# Patient Record
Sex: Female | Born: 1980 | Race: Black or African American | Hispanic: No | Marital: Single | Smoking: Former smoker
Health system: Southern US, Community
[De-identification: ages and names within clinical notes are randomized; demographics above are authoritative.]

## PROBLEM LIST (undated history)

## (undated) ENCOUNTER — Emergency Department (HOSPITAL_BASED_OUTPATIENT_CLINIC_OR_DEPARTMENT_OTHER): Admission: EM | Payer: Medicaid Other | Source: Home / Self Care

## (undated) DIAGNOSIS — R87619 Unspecified abnormal cytological findings in specimens from cervix uteri: Secondary | ICD-10-CM

## (undated) DIAGNOSIS — I10 Essential (primary) hypertension: Secondary | ICD-10-CM

## (undated) DIAGNOSIS — F419 Anxiety disorder, unspecified: Secondary | ICD-10-CM

## (undated) HISTORY — PX: TONSILLECTOMY AND ADENOIDECTOMY: SUR1326

## (undated) HISTORY — PX: OTHER SURGICAL HISTORY: SHX169

## (undated) HISTORY — DX: Unspecified abnormal cytological findings in specimens from cervix uteri: R87.619

## (undated) HISTORY — PX: BUNIONECTOMY: SHX129

## (undated) HISTORY — DX: Essential (primary) hypertension: I10

---

## 2009-01-18 ENCOUNTER — Emergency Department (HOSPITAL_BASED_OUTPATIENT_CLINIC_OR_DEPARTMENT_OTHER): Admission: EM | Admit: 2009-01-18 | Discharge: 2009-01-18 | Payer: Self-pay | Admitting: Emergency Medicine

## 2009-08-26 ENCOUNTER — Emergency Department (HOSPITAL_BASED_OUTPATIENT_CLINIC_OR_DEPARTMENT_OTHER): Admission: EM | Admit: 2009-08-26 | Discharge: 2009-08-26 | Payer: Self-pay | Admitting: Emergency Medicine

## 2009-11-27 ENCOUNTER — Emergency Department (HOSPITAL_BASED_OUTPATIENT_CLINIC_OR_DEPARTMENT_OTHER): Admission: EM | Admit: 2009-11-27 | Discharge: 2009-11-27 | Payer: Self-pay | Admitting: Emergency Medicine

## 2009-11-27 ENCOUNTER — Ambulatory Visit: Payer: Self-pay | Admitting: Diagnostic Radiology

## 2009-12-15 ENCOUNTER — Emergency Department (HOSPITAL_BASED_OUTPATIENT_CLINIC_OR_DEPARTMENT_OTHER): Admission: EM | Admit: 2009-12-15 | Discharge: 2009-12-15 | Payer: Self-pay | Admitting: Emergency Medicine

## 2010-01-26 ENCOUNTER — Emergency Department (HOSPITAL_BASED_OUTPATIENT_CLINIC_OR_DEPARTMENT_OTHER): Admission: EM | Admit: 2010-01-26 | Discharge: 2010-01-27 | Payer: Self-pay | Admitting: Emergency Medicine

## 2010-01-27 ENCOUNTER — Ambulatory Visit: Payer: Self-pay | Admitting: Radiology

## 2010-02-26 ENCOUNTER — Emergency Department (HOSPITAL_BASED_OUTPATIENT_CLINIC_OR_DEPARTMENT_OTHER): Admission: EM | Admit: 2010-02-26 | Discharge: 2010-02-26 | Payer: Self-pay | Admitting: Emergency Medicine

## 2010-03-05 ENCOUNTER — Emergency Department (HOSPITAL_BASED_OUTPATIENT_CLINIC_OR_DEPARTMENT_OTHER): Admission: EM | Admit: 2010-03-05 | Discharge: 2010-03-06 | Payer: Self-pay | Admitting: Emergency Medicine

## 2010-06-10 ENCOUNTER — Emergency Department (HOSPITAL_BASED_OUTPATIENT_CLINIC_OR_DEPARTMENT_OTHER): Admission: EM | Admit: 2010-06-10 | Discharge: 2010-06-10 | Payer: Self-pay | Admitting: Emergency Medicine

## 2010-07-13 ENCOUNTER — Emergency Department (HOSPITAL_BASED_OUTPATIENT_CLINIC_OR_DEPARTMENT_OTHER): Admission: EM | Admit: 2010-07-13 | Discharge: 2010-07-13 | Payer: Self-pay | Admitting: Emergency Medicine

## 2011-02-17 LAB — URINALYSIS, ROUTINE W REFLEX MICROSCOPIC
Glucose, UA: NEGATIVE mg/dL
Nitrite: NEGATIVE
Urobilinogen, UA: 0.2 mg/dL (ref 0.0–1.0)

## 2011-02-17 LAB — WET PREP, GENITAL: Yeast Wet Prep HPF POC: NONE SEEN

## 2011-02-17 LAB — URINE MICROSCOPIC-ADD ON

## 2011-02-17 LAB — URINE CULTURE
Colony Count: >=100000
Culture  Setup Time: 201108102114

## 2011-02-19 LAB — GC/CHLAMYDIA PROBE AMP, GENITAL
Chlamydia, DNA Probe: NEGATIVE
GC Probe Amp, Genital: NEGATIVE

## 2011-02-19 LAB — WET PREP, GENITAL

## 2011-02-22 LAB — WET PREP, GENITAL
Clue Cells Wet Prep HPF POC: NONE SEEN
Trich, Wet Prep: NONE SEEN
Yeast Wet Prep HPF POC: NONE SEEN

## 2011-02-22 LAB — URINALYSIS, ROUTINE W REFLEX MICROSCOPIC
Glucose, UA: NEGATIVE mg/dL
pH: 7 (ref 5.0–8.0)

## 2011-02-27 LAB — WOUND CULTURE

## 2011-03-10 LAB — RAPID STREP SCREEN (MED CTR MEBANE ONLY): Streptococcus, Group A Screen (Direct): NEGATIVE

## 2011-09-03 ENCOUNTER — Emergency Department (HOSPITAL_BASED_OUTPATIENT_CLINIC_OR_DEPARTMENT_OTHER)
Admission: EM | Admit: 2011-09-03 | Discharge: 2011-09-03 | Disposition: A | Discharge status: Home / Self Care | Payer: Medicaid Other | Attending: Emergency Medicine | Admitting: Emergency Medicine

## 2011-09-03 ENCOUNTER — Encounter: Payer: Self-pay | Admitting: *Deleted

## 2011-09-03 DIAGNOSIS — Z4689 Encounter for fitting and adjustment of other specified devices: Secondary | ICD-10-CM | POA: Insufficient documentation

## 2011-09-03 DIAGNOSIS — M79609 Pain in unspecified limb: Secondary | ICD-10-CM | POA: Insufficient documentation

## 2011-09-03 HISTORY — DX: Anxiety disorder, unspecified: F41.9

## 2011-09-03 MED ORDER — HYDROCODONE-ACETAMINOPHEN 5-325 MG PO TABS: 2.0000 | ORAL_TABLET | ORAL | Status: AC | PRN
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
## 2011-09-03 NOTE — ED Provider Notes (Signed)
Medical screening examination/treatment/procedure(s) were performed by non-physician practitioner and as supervising physician I was immediately available for consultation/collaboration.   Joya Gaskins, MD 09/03/11 [phone]
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
## 2012-04-26 ENCOUNTER — Emergency Department (HOSPITAL_BASED_OUTPATIENT_CLINIC_OR_DEPARTMENT_OTHER)
Admission: EM | Admit: 2012-04-26 | Discharge: 2012-04-26 | Disposition: A | Discharge status: Home / Self Care | Payer: Self-pay | Attending: Emergency Medicine | Admitting: Emergency Medicine

## 2012-04-26 ENCOUNTER — Encounter (HOSPITAL_BASED_OUTPATIENT_CLINIC_OR_DEPARTMENT_OTHER): Payer: Self-pay | Admitting: *Deleted

## 2012-04-26 DIAGNOSIS — G43909 Migraine, unspecified, not intractable, without status migrainosus: Secondary | ICD-10-CM | POA: Insufficient documentation

## 2012-04-26 DIAGNOSIS — F17200 Nicotine dependence, unspecified, uncomplicated: Secondary | ICD-10-CM | POA: Insufficient documentation

## 2012-04-26 MED ORDER — SUMATRIPTAN SUCCINATE 50 MG PO TABS: 50.0000 mg | ORAL_TABLET | ORAL | Status: DC | PRN

## 2012-04-26 MED ORDER — DROPERIDOL 2.5 MG/ML IJ SOLN
2.5000 mg | Freq: Once | INTRAMUSCULAR | Status: AC
  Administered 2012-04-26: 2.5 mg via INTRAVENOUS
  Filled 2012-04-26: qty 2

## 2012-04-26 MED ORDER — PROMETHAZINE HCL 12.5 MG PO TABS: 12.5000 mg | ORAL_TABLET | Freq: Four times a day (QID) | ORAL | Status: DC | PRN

## 2012-04-26 MED ORDER — SODIUM CHLORIDE 0.9 % IV BOLUS (SEPSIS)
1000.0000 mL | Freq: Once | INTRAVENOUS | Status: AC
  Administered 2012-04-26: 1000 mL via INTRAVENOUS

## 2012-04-26 MED ORDER — SUMATRIPTAN SUCCINATE 6 MG/0.5ML ~~LOC~~ SOLN
6.0000 mg | Freq: Once | SUBCUTANEOUS | Status: AC
  Administered 2012-04-26: 6 mg via SUBCUTANEOUS
  Filled 2012-04-26: qty 0.5

## 2012-04-26 NOTE — ED Provider Notes (Signed)
History     CSN: [id number]  Arrival date & time 04/26/12  1945   First MD Initiated Contact with Patient 04/26/12 2057      No chief complaint on file.   (Consider location/radiation/quality/duration/timing/severity/associated sxs/prior treatment) HPI The patient is a 31 yo woman, history of migraines and anxiety, presenting with headache.  The patient awoke this morning at 6:30 am with a headache, described as a right, frontal, throbbing head pain, with no radiation, with associated photophobia, phonophobia, and vomiting x3.  She took a tramadol with no relief of symptoms.  She notes a history of similar headaches, previously diagnosed as migraines, occurring 1-2 times/year.  No focal neuro symptoms, fever, chills, cp, SOB.   Past Medical History  Diagnosis Date  . Migraine   . Anxiety     Past Surgical History  Procedure Date  . Breast lumpectomy   . Tonsillectomy and adenoidectomy   . Bunionectomy     No family history on file.  History  Substance Use Topics  . Smoking status: Current Some Day Smoker  . Smokeless tobacco: Not on file  . Alcohol Use: No    OB History    Grav Para Term Preterm Abortions TAB SAB Ect Mult Living                  Review of Systems General: no fevers, chills, changes in weight, changes in appetite Skin: no rash HEENT: no blurry vision, hearing changes, sore throat Pulm: no dyspnea, coughing, wheezing CV: no chest pain, palpitations, shortness of breath Abd: no abdominal pain, diarrhea/constipation GU: no dysuria, hematuria, polyuria Ext: no arthralgias, myalgias Neuro: no weakness, numbness, or tingling  Allergies  Penicillins and Sulfa antibiotics  Home Medications   Current Outpatient Rx  Name Route Sig Dispense Refill  . ONE-A-DAY WOMENS FORMULA PO Oral Take 1 tablet by mouth daily.      BP 148/95  Pulse 84  Temp(Src) 98.4 F (36.9 C) (Oral)  Resp 20  SpO2 100%  Physical Exam General: lying in bed, eyes  closed, appears very uncomfortable HEENT: pupils equal round and reactive to light though exam limited by photophobia, EOMI, oropharynx clear and non-erythematous  Neck: supple, no lymphadenopathy Lungs: clear to ascultation bilaterally, normal work of respiration, no wheezes, rales, ronchi Heart: regular rate and rhythm, no murmurs, gallops, or rubs Abdomen: soft, non-tender, non-distended, normal bowel sounds Msk: no joint edema, warmth, or erythema Extremities: no cyanosis, clubbing, or edema Neurologic: alert & oriented X3, cranial nerves II-XII intact, strength grossly intact, sensation intact to light touch  ED Course  Procedures (including critical care time)  Labs Reviewed - No data to display No results found.   No diagnosis found.    MDM   # Migraine - the patient is a 31 yo woman, history of migraines, presenting with a headache consistent with acute migraine. -1 L NS -sumatriptan, droperidol  # Dispo - likely discharge home pending symptomatic improvement.   Addendum 10:15 pm - symptoms dramatically improved, pain decreased from 10/10 to 3/10.  Will discharge home.  Linward Headland, MD 04/26/12 2215

## 2012-04-26 NOTE — ED Notes (Signed)
Woke this am with headache, nausea and vomiting.

## 2012-04-26 NOTE — Discharge Instructions (Signed)

## 2012-04-28 NOTE — ED Provider Notes (Signed)
I saw and evaluated the patient, reviewed the resident's note and I agree with the findings and plan.   .Face to face Exam:  General:  Awake HEENT:  Atraumatic Resp:  Normal effort Abd:  Nondistended Neuro:No focal weakness Lymph: No adenopathy   Jaeleigh Monaco L Daine Gunther, MD 04/28/12 0946 

## 2012-08-21 ENCOUNTER — Encounter (HOSPITAL_BASED_OUTPATIENT_CLINIC_OR_DEPARTMENT_OTHER): Payer: Self-pay

## 2012-08-21 ENCOUNTER — Emergency Department (HOSPITAL_BASED_OUTPATIENT_CLINIC_OR_DEPARTMENT_OTHER)
Admission: EM | Admit: 2012-08-21 | Discharge: 2012-08-21 | Disposition: A | Discharge status: Home / Self Care | Payer: Medicaid Other | Attending: Emergency Medicine | Admitting: Emergency Medicine

## 2012-08-21 DIAGNOSIS — Z882 Allergy status to sulfonamides status: Secondary | ICD-10-CM | POA: Insufficient documentation

## 2012-08-21 DIAGNOSIS — F411 Generalized anxiety disorder: Secondary | ICD-10-CM | POA: Insufficient documentation

## 2012-08-21 DIAGNOSIS — Z880 Allergy status to penicillin: Secondary | ICD-10-CM | POA: Insufficient documentation

## 2012-08-21 DIAGNOSIS — T8149XA Infection following a procedure, other surgical site, initial encounter: Secondary | ICD-10-CM

## 2012-08-21 DIAGNOSIS — Z87891 Personal history of nicotine dependence: Secondary | ICD-10-CM | POA: Insufficient documentation

## 2012-08-21 DIAGNOSIS — Y849 Medical procedure, unspecified as the cause of abnormal reaction of the patient, or of later complication, without mention of misadventure at the time of the procedure: Secondary | ICD-10-CM | POA: Insufficient documentation

## 2012-08-21 DIAGNOSIS — T8140XA Infection following a procedure, unspecified, initial encounter: Secondary | ICD-10-CM | POA: Insufficient documentation

## 2012-08-21 MED ORDER — HYDROCODONE-ACETAMINOPHEN 5-325 MG PO TABS: 2.0000 | ORAL_TABLET | ORAL | Status: DC | PRN

## 2012-08-21 MED ORDER — CEPHALEXIN 500 MG PO CAPS: 500.0000 mg | ORAL_CAPSULE | Freq: Four times a day (QID) | ORAL | Status: DC

## 2012-08-21 MED ORDER — DOXYCYCLINE HYCLATE 100 MG PO CAPS: 100.0000 mg | ORAL_CAPSULE | Freq: Two times a day (BID) | ORAL | Status: DC

## 2012-08-21 MED ORDER — HYDROCODONE-ACETAMINOPHEN 5-325 MG PO TABS
2.0000 | ORAL_TABLET | Freq: Once | ORAL | Status: AC
  Administered 2012-08-21: 2 via ORAL
  Filled 2012-08-21: qty 2

## 2012-08-21 MED ORDER — DOXYCYCLINE HYCLATE 100 MG PO TABS
100.0000 mg | ORAL_TABLET | Freq: Once | ORAL | Status: AC
  Administered 2012-08-21: 100 mg via ORAL
  Filled 2012-08-21: qty 1

## 2012-08-21 MED ORDER — CEPHALEXIN 250 MG PO CAPS
500.0000 mg | ORAL_CAPSULE | Freq: Once | ORAL | Status: AC
  Administered 2012-08-21: 500 mg via ORAL
  Filled 2012-08-21: qty 2

## 2012-08-21 NOTE — ED Provider Notes (Signed)
Medical screening examination/treatment/procedure(s) were performed by non-physician practitioner and as supervising physician I was immediately available for consultation/collaboration.  Stephannie Broner, MD 08/21/12 2316 

## 2012-08-21 NOTE — ED Notes (Signed)
Reports cyst removed from left breast last week-sutures removed yesterday-drainage started today

## 2012-08-21 NOTE — ED Provider Notes (Signed)
History     CSN: [id number]  Arrival date & time 08/21/12  2021   First MD Initiated Contact with Patient 08/21/12 2054      Chief Complaint  Patient presents with  . Breast Problem    (Consider location/radiation/quality/duration/timing/severity/associated sxs/prior treatment) Patient is a 31 y.o. female presenting with abscess. The history is provided by the patient. No language interpreter was used.  Abscess  This is a new problem. The current episode started more than one week ago. The onset was gradual. The problem has been unchanged. The abscess is characterized by redness and draining. There were no sick contacts.  Pt reports Dr. Simonne Maffucci cut a cyst out of her left breast a week ago.  Pt reports sutures removed yesterday.Pt reports area is swollen and has push draining  Past Medical History  Diagnosis Date  . Migraine   . Anxiety     Past Surgical History  Procedure Date  . Breast lumpectomy   . Tonsillectomy and adenoidectomy   . Bunionectomy   . Breast cyst     No family history on file.  History  Substance Use Topics  . Smoking status: Former Games developer  . Smokeless tobacco: Not on file  . Alcohol Use: No    OB History    Grav Para Term Preterm Abortions TAB SAB Ect Mult Living                  Review of Systems  Skin: Positive for wound.  All other systems reviewed and are negative.    Allergies  Penicillins and Sulfa antibiotics  Home Medications   Current Outpatient Rx  Name Route Sig Dispense Refill  . ONE-A-DAY WOMENS FORMULA PO Oral Take 1 tablet by mouth daily.    Marland Kitchen PROMETHAZINE HCL 12.5 MG PO TABS Oral Take 1 tablet (12.5 mg total) by mouth every 6 (six) hours as needed for nausea. 30 tablet 0  . SUMATRIPTAN SUCCINATE 50 MG PO TABS Oral Take 1 tablet (50 mg total) by mouth every 2 (two) hours as needed for migraine (Do not take more than 200 mg in 24 hours). 10 tablet 0    BP 145/94  Pulse 95  Temp 99.2 F (37.3 C) (Oral)  Resp 16  Ht  5\' 7"  (1.702 m)  Wt 184 lb (83.462 kg)  BMI 28.82 kg/m2  SpO2 100%  LMP 07/10/2012  Physical Exam  Nursing note and vitals reviewed. Constitutional: She appears well-developed and well-nourished.  HENT:  Head: Normocephalic.  Pulmonary/Chest:       Incision left breast,  Purulent drainage,  Erythema around area  Musculoskeletal: Normal range of motion.  Neurological: She is alert.  Skin: There is erythema.    ED Course  Procedures (including critical care time)  Labs Reviewed - No data to display No results found.   No diagnosis found.    MDM  Pt given rx for keflex, doxycycline and hydrocodone        Lonia Skinner [place], [place] 08/21/12 2120

## 2012-09-10 ENCOUNTER — Emergency Department (HOSPITAL_BASED_OUTPATIENT_CLINIC_OR_DEPARTMENT_OTHER): Payer: No Typology Code available for payment source

## 2012-09-10 ENCOUNTER — Emergency Department (HOSPITAL_BASED_OUTPATIENT_CLINIC_OR_DEPARTMENT_OTHER)
Admission: EM | Admit: 2012-09-10 | Discharge: 2012-09-10 | Disposition: A | Discharge status: Home / Self Care | Payer: No Typology Code available for payment source | Attending: Emergency Medicine | Admitting: Emergency Medicine

## 2012-09-10 ENCOUNTER — Encounter (HOSPITAL_BASED_OUTPATIENT_CLINIC_OR_DEPARTMENT_OTHER): Payer: Self-pay | Admitting: *Deleted

## 2012-09-10 DIAGNOSIS — F411 Generalized anxiety disorder: Secondary | ICD-10-CM | POA: Insufficient documentation

## 2012-09-10 DIAGNOSIS — Z882 Allergy status to sulfonamides status: Secondary | ICD-10-CM | POA: Insufficient documentation

## 2012-09-10 DIAGNOSIS — G43909 Migraine, unspecified, not intractable, without status migrainosus: Secondary | ICD-10-CM | POA: Insufficient documentation

## 2012-09-10 DIAGNOSIS — Y92410 Unspecified street and highway as the place of occurrence of the external cause: Secondary | ICD-10-CM | POA: Insufficient documentation

## 2012-09-10 DIAGNOSIS — S39012A Strain of muscle, fascia and tendon of lower back, initial encounter: Secondary | ICD-10-CM

## 2012-09-10 DIAGNOSIS — Z880 Allergy status to penicillin: Secondary | ICD-10-CM | POA: Insufficient documentation

## 2012-09-10 DIAGNOSIS — Z87891 Personal history of nicotine dependence: Secondary | ICD-10-CM | POA: Insufficient documentation

## 2012-09-10 DIAGNOSIS — S335XXA Sprain of ligaments of lumbar spine, initial encounter: Secondary | ICD-10-CM | POA: Insufficient documentation
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
## 2012-10-05 ENCOUNTER — Emergency Department (HOSPITAL_BASED_OUTPATIENT_CLINIC_OR_DEPARTMENT_OTHER)
Admission: EM | Admit: 2012-10-05 | Discharge: 2012-10-06 | Discharge status: Against Medical Advice | Payer: Medicaid Other | Attending: Emergency Medicine | Admitting: Emergency Medicine

## 2012-10-05 ENCOUNTER — Encounter (HOSPITAL_BASED_OUTPATIENT_CLINIC_OR_DEPARTMENT_OTHER): Payer: Self-pay | Admitting: *Deleted

## 2012-10-05 DIAGNOSIS — R51 Headache: Secondary | ICD-10-CM

## 2012-10-05 DIAGNOSIS — Z87891 Personal history of nicotine dependence: Secondary | ICD-10-CM | POA: Insufficient documentation

## 2012-10-05 DIAGNOSIS — F411 Generalized anxiety disorder: Secondary | ICD-10-CM | POA: Insufficient documentation

## 2012-10-05 MED ORDER — METOCLOPRAMIDE HCL 5 MG/ML IJ SOLN
10.0000 mg | Freq: Once | INTRAMUSCULAR | Status: DC
  Filled 2012-10-05: qty 2

## 2012-10-05 MED ORDER — KETOROLAC TROMETHAMINE 60 MG/2ML IM SOLN
60.0000 mg | Freq: Once | INTRAMUSCULAR | Status: DC
  Filled 2012-10-05: qty 2

## 2012-10-05 MED ORDER — DIPHENHYDRAMINE HCL 50 MG/ML IJ SOLN
25.0000 mg | Freq: Once | INTRAMUSCULAR | Status: DC
  Filled 2012-10-05: qty 1

## 2012-10-05 NOTE — ED Notes (Signed)
C/o "migraine" h/a that started this morning. States pain is left frontal. C/o light sensitivity. C/o nausea. States this is typical of her migraine h/a's. Pt. States she does have a ride home.

## 2012-10-05 NOTE — ED Provider Notes (Signed)
History     CSN: [id number]  Arrival date & time 10/05/12  2144   First MD Initiated Contact with Patient 10/05/12 2230      Chief Complaint  Patient presents with  . Migraine    (Consider location/radiation/quality/duration/timing/severity/associated sxs/prior treatment) Patient is a 31 y.o. female presenting with migraines. The history is provided by the patient.  Migraine This is a new problem. The current episode started today. The problem occurs constantly. The problem has been gradually worsening. Associated symptoms include headaches. Nothing aggravates the symptoms.   Pt complains of a headache today.  Pt reports she has headaches.  Pt reports she has gotten injections in the past that have helped headaches.  Past Medical History  Diagnosis Date  . Migraine   . Anxiety     Past Surgical History  Procedure Date  . Breast lumpectomy   . Tonsillectomy and adenoidectomy   . Bunionectomy   . Breast cyst     No family history on file.  History  Substance Use Topics  . Smoking status: Former Games developer  . Smokeless tobacco: Not on file  . Alcohol Use: No    OB History    Grav Para Term Preterm Abortions TAB SAB Ect Mult Living                  Review of Systems  Neurological: Positive for headaches.  All other systems reviewed and are negative.    Allergies  Penicillins and Sulfa antibiotics  Home Medications   Current Outpatient Rx  Name Route Sig Dispense Refill  . ONE-A-DAY WOMENS FORMULA PO Oral Take 1 tablet by mouth daily.    . CEPHALEXIN 500 MG PO CAPS Oral Take 1 capsule (500 mg total) by mouth 4 (four) times daily. 40 capsule 0  . DOXYCYCLINE HYCLATE 100 MG PO CAPS Oral Take 1 capsule (100 mg total) by mouth 2 (two) times daily. 20 capsule 0  . HYDROCODONE-ACETAMINOPHEN 5-325 MG PO TABS Oral Take 2 tablets by mouth every 4 (four) hours as needed for pain. 10 tablet 0  . PROMETHAZINE HCL 12.5 MG PO TABS Oral Take 1 tablet (12.5 mg total) by mouth  every 6 (six) hours as needed for nausea. 30 tablet 0  . SUMATRIPTAN SUCCINATE 50 MG PO TABS Oral Take 1 tablet (50 mg total) by mouth every 2 (two) hours as needed for migraine (Do not take more than 200 mg in 24 hours). 10 tablet 0    BP 139/100  Pulse 94  Temp 98 F (36.7 C)  Resp 18  Ht 5\' 7"  (1.702 m)  Wt 184 lb (83.462 kg)  BMI 28.82 kg/m2  SpO2 99%  LMP 09/30/2012  Physical Exam  Nursing note and vitals reviewed. Constitutional: She is oriented to person, place, and time. She appears well-developed and well-nourished.  HENT:  Head: Normocephalic and atraumatic.  Right Ear: External ear normal.  Left Ear: External ear normal.  Nose: Nose normal.  Mouth/Throat: Oropharynx is clear and moist.  Eyes: Conjunctivae normal and EOM are normal. Pupils are equal, round, and reactive to light.  Neck: Normal range of motion. Neck supple.  Cardiovascular: Normal rate, regular rhythm and normal heart sounds.   Pulmonary/Chest: Effort normal and breath sounds normal.  Abdominal: Soft.  Musculoskeletal: Normal range of motion.  Neurological: She is alert and oriented to person, place, and time.  Skin: Skin is warm.  Psychiatric: She has a normal mood and affect.    ED Course  Procedures (  including critical care time)  Labs Reviewed - No data to display No results found.   No diagnosis found.    MDM  I ordered reglan, torodol and benadryl.    Pt left ama  before getting medications        Lonia Skinner [place], [place] 10/05/12 2355

## 2012-10-05 NOTE — ED Notes (Signed)
Pt reports photophobia, some nausea.  Pt denies any visual changes or recent trauma.  Neuro assessment unremarkable

## 2012-10-05 NOTE — ED Notes (Signed)
Pt left AMA. Pt was found not to be in her room or in the waiting room. Charge nurse notified that she had left the building. Medication returned.

## 2012-10-06 NOTE — ED Provider Notes (Signed)
Medical screening examination/treatment/procedure(s) were performed by non-physician practitioner and as supervising physician I was immediately available for consultation/collaboration.  Jones Skene, M.D.     Jones Skene, MD 10/06/12 1014

## 2013-01-12 ENCOUNTER — Emergency Department (HOSPITAL_BASED_OUTPATIENT_CLINIC_OR_DEPARTMENT_OTHER)
Admission: EM | Admit: 2013-01-12 | Discharge: 2013-01-12 | Disposition: A | Discharge status: Home / Self Care | Payer: Medicaid Other | Attending: Emergency Medicine | Admitting: Emergency Medicine

## 2013-01-12 ENCOUNTER — Encounter (HOSPITAL_BASED_OUTPATIENT_CLINIC_OR_DEPARTMENT_OTHER): Payer: Self-pay | Admitting: *Deleted

## 2013-01-12 DIAGNOSIS — Z8659 Personal history of other mental and behavioral disorders: Secondary | ICD-10-CM | POA: Insufficient documentation

## 2013-01-12 DIAGNOSIS — Z87891 Personal history of nicotine dependence: Secondary | ICD-10-CM | POA: Insufficient documentation

## 2013-01-12 DIAGNOSIS — H53149 Visual discomfort, unspecified: Secondary | ICD-10-CM | POA: Insufficient documentation

## 2013-01-12 DIAGNOSIS — G43909 Migraine, unspecified, not intractable, without status migrainosus: Secondary | ICD-10-CM | POA: Insufficient documentation

## 2013-01-12 DIAGNOSIS — R110 Nausea: Secondary | ICD-10-CM | POA: Insufficient documentation

## 2013-01-12 MED ORDER — DIPHENHYDRAMINE HCL 50 MG/ML IJ SOLN
50.0000 mg | Freq: Once | INTRAMUSCULAR | Status: AC
  Administered 2013-01-12: 50 mg via INTRAMUSCULAR
  Filled 2013-01-12: qty 1

## 2013-01-12 MED ORDER — METOCLOPRAMIDE HCL 10 MG PO TABS
10.0000 mg | ORAL_TABLET | Freq: Once | ORAL | Status: AC
  Administered 2013-01-12: 10 mg via ORAL
  Filled 2013-01-12: qty 1

## 2013-01-12 MED ORDER — METOCLOPRAMIDE HCL 5 MG/ML IJ SOLN
10.0000 mg | Freq: Once | INTRAMUSCULAR | Status: AC
  Administered 2013-01-12: 10 mg via INTRAVENOUS
  Filled 2013-01-12: qty 2

## 2013-01-12 MED ORDER — SODIUM CHLORIDE 0.9 % IV BOLUS (SEPSIS)
1000.0000 mL | Freq: Once | INTRAVENOUS | Status: AC
  Administered 2013-01-12: 1000 mL via INTRAVENOUS

## 2013-01-12 MED ORDER — PROMETHAZINE HCL 25 MG/ML IJ SOLN
25.0000 mg | Freq: Once | INTRAMUSCULAR | Status: AC
  Administered 2013-01-12: 25 mg via INTRAVENOUS
  Filled 2013-01-12: qty 1

## 2013-01-12 NOTE — ED Provider Notes (Signed)
History  This chart was scribed for Richardean Canal, MD by Shari Heritage, ED Scribe. The patient was seen in room MH05/MH05. Patient's care was started at 1923.  CSN: [id number]  Arrival date & time 01/12/13  9562   First MD Initiated Contact with Patient 01/12/13 1923      Chief Complaint  Patient presents with  . Migraine     The history is provided by the patient. No language interpreter was used.     HPI Comments: Lindsey Ruiz is a 32 y.o. female who presents to the Emergency Department complaining of worsening, moderate, constant, non-radiating headache onset yesterday afternoon. There is associated nausea and photophobia. Patient took 2 Aleve PTA in the ED about 2 hours ago with no relief. Patient has seen a neurologist for this problem and was prescribed medications, but patient cannot remember the name and hasn't been on them for several weeks or months. Patient was last seen for a migraine here in October 2013. She has been treated by both IV meds and injections in the past. Patient's other medical history includes anxiety.     Past Medical History  Diagnosis Date  . Migraine   . Anxiety     Past Surgical History  Procedure Laterality Date  . Breast lumpectomy    . Tonsillectomy and adenoidectomy    . Bunionectomy    . Breast cyst      History reviewed. No pertinent family history.  History  Substance Use Topics  . Smoking status: Former Games developer  . Smokeless tobacco: Not on file  . Alcohol Use: No    OB History   Grav Para Term Preterm Abortions TAB SAB Ect Mult Living                  Review of Systems  Eyes: Positive for photophobia.  Gastrointestinal: Positive for nausea. Negative for vomiting.  Neurological: Positive for headaches.  All other systems reviewed and are negative.    Allergies  Penicillins and Sulfa antibiotics  Home Medications   Current Outpatient Rx  Name  Route  Sig  Dispense  Refill  . cephALEXin (KEFLEX) 500 MG capsule  Oral   Take 1 capsule (500 mg total) by mouth 4 (four) times daily.   40 capsule   0   . doxycycline (VIBRAMYCIN) 100 MG capsule   Oral   Take 1 capsule (100 mg total) by mouth 2 (two) times daily.   20 capsule   0   . HYDROcodone-acetaminophen (NORCO/VICODIN) 5-325 MG per tablet   Oral   Take 2 tablets by mouth every 4 (four) hours as needed for pain.   10 tablet   0   . Multiple Vitamins-Calcium (ONE-A-DAY WOMENS FORMULA PO)   Oral   Take 1 tablet by mouth daily.         Marland Kitchen EXPIRED: promethazine (PHENERGAN) 12.5 MG tablet   Oral   Take 1 tablet (12.5 mg total) by mouth every 6 (six) hours as needed for nausea.   30 tablet   0   . SUMAtriptan (IMITREX) 50 MG tablet   Oral   Take 1 tablet (50 mg total) by mouth every 2 (two) hours as needed for migraine (Do not take more than 200 mg in 24 hours).   10 tablet   0     Triage Vitals: BP 146/97  Pulse 105  Temp(Src) 98.4 F (36.9 C) (Oral)  Resp 16  Ht 5\' 7"  (1.702 m)  Wt 184 lb (83.462  kg)  BMI 28.81 kg/m2  SpO2 100%  LMP 12/18/2012  Physical Exam  Constitutional: She is oriented to person, place, and time. She appears well-developed and well-nourished. No distress.  HENT:  Head: Normocephalic and atraumatic.  Eyes: Conjunctivae and EOM are normal. Pupils are equal, round, and reactive to light.  Neck: Normal range of motion.  Cardiovascular: Normal rate, regular rhythm and normal heart sounds.   Pulmonary/Chest: Effort normal and breath sounds normal.  Neurological: She is alert and oriented to person, place, and time.  No focal neurological deficits.   Skin: Skin is warm and dry. No rash noted.  Psychiatric: She has a normal mood and affect. Her behavior is normal.    ED Course  Procedures (including critical care time) DIAGNOSTIC STUDIES: Oxygen Saturation is 100% on room air, normal by my interpretation.    COORDINATION OF CARE: 7:37 PM- Patient informed of current plan for treatment and evaluation  and agrees with plan at this time.   8:12 PM- Patient's headache is unchanged after 1 tablet of Reglan 10 mg and shot of Benadryl 50 mg. Will treat with IV medicines.   8:15 PM- Have ordered 1000 ml IV fluids, Phenergan 25 mg and additional Reglan 10 mg dose.  9:54 PM- Patient is improved. Sleeping, resting comfortably.   Labs Reviewed - No data to display No results found.   No diagnosis found.    MDM  Lindsey Ruiz is a 32 y.o. female here with headaches. Headaches similar to her typical migraines. No concerning red flags for subarachnoid. Given reglan, phenergan. Now sleeping comfortably. Will d/c home. Recommend that she f/u with her neurologist.     I personally performed the services described in this documentation, which was scribed in my presence. The recorded information has been reviewed and is accurate.    Richardean Canal, MD 01/12/13 2156

## 2013-01-12 NOTE — ED Notes (Signed)
Migraine since yesterday. +nausea +sensitivity to light PERL

## 2013-02-18 ENCOUNTER — Emergency Department (HOSPITAL_BASED_OUTPATIENT_CLINIC_OR_DEPARTMENT_OTHER)
Admission: EM | Admit: 2013-02-18 | Discharge: 2013-02-18 | Disposition: A | Discharge status: Home / Self Care | Payer: Medicaid Other | Attending: Emergency Medicine | Admitting: Emergency Medicine

## 2013-02-18 ENCOUNTER — Encounter (HOSPITAL_BASED_OUTPATIENT_CLINIC_OR_DEPARTMENT_OTHER): Payer: Self-pay | Admitting: *Deleted

## 2013-02-18 DIAGNOSIS — Z87891 Personal history of nicotine dependence: Secondary | ICD-10-CM | POA: Insufficient documentation

## 2013-02-18 DIAGNOSIS — Z3202 Encounter for pregnancy test, result negative: Secondary | ICD-10-CM | POA: Insufficient documentation

## 2013-02-18 DIAGNOSIS — N390 Urinary tract infection, site not specified: Secondary | ICD-10-CM

## 2013-02-18 DIAGNOSIS — Z8659 Personal history of other mental and behavioral disorders: Secondary | ICD-10-CM | POA: Insufficient documentation

## 2013-02-18 DIAGNOSIS — Z79899 Other long term (current) drug therapy: Secondary | ICD-10-CM | POA: Insufficient documentation

## 2013-02-18 DIAGNOSIS — Z8679 Personal history of other diseases of the circulatory system: Secondary | ICD-10-CM | POA: Insufficient documentation

## 2013-02-18 LAB — URINE MICROSCOPIC-ADD ON

## 2013-02-18 LAB — URINALYSIS, ROUTINE W REFLEX MICROSCOPIC
Bilirubin Urine: NEGATIVE
Nitrite: NEGATIVE
Specific Gravity, Urine: 1.012 (ref 1.005–1.030)
Urobilinogen, UA: 1 mg/dL (ref 0.0–1.0)
pH: 7 (ref 5.0–8.0)

## 2013-02-18 LAB — PREGNANCY, URINE: Preg Test, Ur: NEGATIVE

## 2013-02-18 MED ORDER — NITROFURANTOIN MONOHYD MACRO 100 MG PO CAPS: 100.0000 mg | ORAL_CAPSULE | Freq: Two times a day (BID) | ORAL | Status: DC

## 2013-02-18 MED ORDER — PHENAZOPYRIDINE HCL 200 MG PO TABS: 200.0000 mg | ORAL_TABLET | Freq: Three times a day (TID) | ORAL | Status: DC

## 2013-02-18 NOTE — ED Notes (Signed)
Woke this am with hematuria.

## 2013-02-18 NOTE — ED Provider Notes (Signed)
History     CSN: [id number]  Arrival date & time 02/18/13  1741   First MD Initiated Contact with Patient 02/18/13 1803      Chief Complaint  Patient presents with  . Hematuria    (Consider location/radiation/quality/duration/timing/severity/associated sxs/prior treatment) HPI Comments: Pt states that she noticed blood in urine with wiping:pt states that it is not with every urination:pt denies fever,n/v/d:pt states that she is having some lower abdominal pressure and burning with urination:pt denies vaginal discharge  Patient is a 32 y.o. female presenting with hematuria. The history is provided by the patient. No language interpreter was used.  Hematuria This is a new problem. The current episode started today. The problem occurs 2 to 4 times per day. The problem has been unchanged. Pertinent negatives include no nausea or vomiting. Nothing aggravates the symptoms. She has tried nothing for the symptoms.    Past Medical History  Diagnosis Date  . Migraine   . Anxiety     Past Surgical History  Procedure Laterality Date  . Breast lumpectomy    . Tonsillectomy and adenoidectomy    . Bunionectomy    . Breast cyst      No family history on file.  History  Substance Use Topics  . Smoking status: Former Games developer  . Smokeless tobacco: Not on file  . Alcohol Use: No    OB History   Grav Para Term Preterm Abortions TAB SAB Ect Mult Living                  Review of Systems  Constitutional: Negative.   Respiratory: Negative.   Cardiovascular: Negative.   Gastrointestinal: Negative for nausea and vomiting.  Genitourinary: Positive for hematuria.    Allergies  Penicillins and Sulfa antibiotics  Home Medications   Current Outpatient Rx  Name  Route  Sig  Dispense  Refill  . Topiramate (TOPAMAX PO)   Oral   Take by mouth.         . cephALEXin (KEFLEX) 500 MG capsule   Oral   Take 1 capsule (500 mg total) by mouth 4 (four) times daily.   40 capsule   0    . doxycycline (VIBRAMYCIN) 100 MG capsule   Oral   Take 1 capsule (100 mg total) by mouth 2 (two) times daily.   20 capsule   0   . HYDROcodone-acetaminophen (NORCO/VICODIN) 5-325 MG per tablet   Oral   Take 2 tablets by mouth every 4 (four) hours as needed for pain.   10 tablet   0   . Multiple Vitamins-Calcium (ONE-A-DAY WOMENS FORMULA PO)   Oral   Take 1 tablet by mouth daily.         Marland Kitchen EXPIRED: promethazine (PHENERGAN) 12.5 MG tablet   Oral   Take 1 tablet (12.5 mg total) by mouth every 6 (six) hours as needed for nausea.   30 tablet   0   . SUMAtriptan (IMITREX) 50 MG tablet   Oral   Take 1 tablet (50 mg total) by mouth every 2 (two) hours as needed for migraine (Do not take more than 200 mg in 24 hours).   10 tablet   0     LMP 01/15/2013  Physical Exam  Nursing note and vitals reviewed. Constitutional: She is oriented to person, place, and time. She appears well-developed and well-nourished.  HENT:  Head: Normocephalic and atraumatic.  Cardiovascular: Normal rate and regular rhythm.   Pulmonary/Chest: Effort normal and breath sounds normal.  Abdominal: Soft. Bowel sounds are normal. There is no tenderness. There is no CVA tenderness.  Musculoskeletal: Normal range of motion.  Neurological: She is alert and oriented to person, place, and time.  Skin: Skin is warm and dry.  Psychiatric: She has a normal mood and affect.    ED Course  Procedures (including critical care time)  Labs Reviewed  URINALYSIS, ROUTINE W REFLEX MICROSCOPIC - Abnormal; Notable for the following:    APPearance CLOUDY (*)    Hgb urine dipstick LARGE (*)    Leukocytes, UA MODERATE (*)    All other components within normal limits  URINE MICROSCOPIC-ADD ON - Abnormal; Notable for the following:    Squamous Epithelial / LPF FEW (*)    Bacteria, UA FEW (*)    All other components within normal limits  URINE CULTURE  PREGNANCY, URINE   No results found.   1. UTI (lower urinary  tract infection)       MDM  Will treat for simple uti:pt is not pregnant and denies vaginal discharge:urine sent for culture        Teressa Lower, NP 02/18/13 1919

## 2013-02-18 NOTE — ED Provider Notes (Signed)
Medical screening examination/treatment/procedure(s) were performed by non-physician practitioner and as supervising physician I was immediately available for consultation/collaboration.   David H Yao, MD 02/18/13 2031 

## 2013-02-18 NOTE — ED Notes (Signed)
Vrinda, NP at bedside.  

## 2013-02-20 LAB — URINE CULTURE: Colony Count: >=100000

## 2013-02-21 NOTE — ED Notes (Signed)
+   Urine Patient treated with Bactrim-sensitive to same-chart appended per protocol MD. 

## 2013-04-14 ENCOUNTER — Emergency Department (HOSPITAL_BASED_OUTPATIENT_CLINIC_OR_DEPARTMENT_OTHER): Payer: Medicaid Other

## 2013-04-14 ENCOUNTER — Emergency Department (HOSPITAL_BASED_OUTPATIENT_CLINIC_OR_DEPARTMENT_OTHER)
Admission: EM | Admit: 2013-04-14 | Discharge: 2013-04-14 | Disposition: A | Discharge status: Home / Self Care | Payer: Medicaid Other | Attending: Emergency Medicine | Admitting: Emergency Medicine

## 2013-04-14 ENCOUNTER — Encounter (HOSPITAL_BASED_OUTPATIENT_CLINIC_OR_DEPARTMENT_OTHER): Payer: Self-pay | Admitting: *Deleted

## 2013-04-14 DIAGNOSIS — W260XXA Contact with knife, initial encounter: Secondary | ICD-10-CM | POA: Insufficient documentation

## 2013-04-14 DIAGNOSIS — Y929 Unspecified place or not applicable: Secondary | ICD-10-CM | POA: Insufficient documentation

## 2013-04-14 DIAGNOSIS — W261XXA Contact with sword or dagger, initial encounter: Secondary | ICD-10-CM | POA: Insufficient documentation

## 2013-04-14 DIAGNOSIS — G43909 Migraine, unspecified, not intractable, without status migrainosus: Secondary | ICD-10-CM | POA: Insufficient documentation

## 2013-04-14 DIAGNOSIS — S61219A Laceration without foreign body of unspecified finger without damage to nail, initial encounter: Secondary | ICD-10-CM

## 2013-04-14 DIAGNOSIS — Z8659 Personal history of other mental and behavioral disorders: Secondary | ICD-10-CM | POA: Insufficient documentation

## 2013-04-14 DIAGNOSIS — Z792 Long term (current) use of antibiotics: Secondary | ICD-10-CM | POA: Insufficient documentation

## 2013-04-14 DIAGNOSIS — Y9389 Activity, other specified: Secondary | ICD-10-CM | POA: Insufficient documentation

## 2013-04-14 DIAGNOSIS — S61209A Unspecified open wound of unspecified finger without damage to nail, initial encounter: Secondary | ICD-10-CM | POA: Insufficient documentation

## 2013-04-14 DIAGNOSIS — Z87891 Personal history of nicotine dependence: Secondary | ICD-10-CM | POA: Insufficient documentation

## 2013-04-14 DIAGNOSIS — Z79899 Other long term (current) drug therapy: Secondary | ICD-10-CM | POA: Insufficient documentation

## 2013-04-14 DIAGNOSIS — Z880 Allergy status to penicillin: Secondary | ICD-10-CM | POA: Insufficient documentation

## 2013-04-14 MED ORDER — HYDROCODONE-ACETAMINOPHEN 5-325 MG PO TABS
1.0000 | ORAL_TABLET | Freq: Once | ORAL | Status: AC
  Administered 2013-04-14: 1 via ORAL
  Filled 2013-04-14: qty 1

## 2013-04-14 NOTE — ED Provider Notes (Signed)
Medical screening examination/treatment/procedure(s) were performed by non-physician practitioner and as supervising physician I was immediately available for consultation/collaboration.   Emilee Market, MD 04/14/13 2310 

## 2013-04-14 NOTE — ED Notes (Signed)
Vrinda, NP at bedside.  

## 2013-04-14 NOTE — ED Provider Notes (Signed)
History     CSN: [id number]  Arrival date & time 04/14/13  2107   First MD Initiated Contact with Patient 04/14/13 2117      Chief Complaint  Patient presents with  . Laceration    (Consider location/radiation/quality/duration/timing/severity/associated sxs/prior treatment) HPI Comments: Pt states that she was cutting an onion and she cut through her nail  Patient is a 32 y.o. female presenting with skin laceration. The history is provided by the patient. No language interpreter was used.  Laceration Location:  Hand Hand laceration location:  R finger Length (cm):  .5 Bleeding: controlled   Laceration mechanism:  Knife Pain details:    Quality:  Aching Foreign body present:  No foreign bodies Relieved by:  Nothing Tetanus status:  Up to date   Past Medical History  Diagnosis Date  . Migraine   . Anxiety     Past Surgical History  Procedure Laterality Date  . Breast lumpectomy    . Tonsillectomy and adenoidectomy    . Bunionectomy    . Breast cyst      No family history on file.  History  Substance Use Topics  . Smoking status: Former Games developer  . Smokeless tobacco: Not on file  . Alcohol Use: No    OB History   Grav Para Term Preterm Abortions TAB SAB Ect Mult Living                  Review of Systems  Constitutional: Negative.   Respiratory: Negative.   Cardiovascular: Negative.     Allergies  Penicillins and Sulfa antibiotics  Home Medications   Current Outpatient Rx  Name  Route  Sig  Dispense  Refill  . cephALEXin (KEFLEX) 500 MG capsule   Oral   Take 1 capsule (500 mg total) by mouth 4 (four) times daily.   40 capsule   0   . doxycycline (VIBRAMYCIN) 100 MG capsule   Oral   Take 1 capsule (100 mg total) by mouth 2 (two) times daily.   20 capsule   0   . HYDROcodone-acetaminophen (NORCO/VICODIN) 5-325 MG per tablet   Oral   Take 2 tablets by mouth every 4 (four) hours as needed for pain.   10 tablet   0   . Multiple  Vitamins-Calcium (ONE-A-DAY WOMENS FORMULA PO)   Oral   Take 1 tablet by mouth daily.         . nitrofurantoin, macrocrystal-monohydrate, (MACROBID) 100 MG capsule   Oral   Take 1 capsule (100 mg total) by mouth 2 (two) times daily.   14 capsule   0   . phenazopyridine (PYRIDIUM) 200 MG tablet   Oral   Take 1 tablet (200 mg total) by mouth 3 (three) times daily.   6 tablet   0   . EXPIRED: promethazine (PHENERGAN) 12.5 MG tablet   Oral   Take 1 tablet (12.5 mg total) by mouth every 6 (six) hours as needed for nausea.   30 tablet   0   . SUMAtriptan (IMITREX) 50 MG tablet   Oral   Take 1 tablet (50 mg total) by mouth every 2 (two) hours as needed for migraine (Do not take more than 200 mg in 24 hours).   10 tablet   0   . Topiramate (TOPAMAX PO)   Oral   Take by mouth.           BP 136/94  Pulse 102  Temp(Src) 98.9 F (37.2 C) (Oral)  Resp 16  Wt 186 lb (84.369 kg)  BMI 29.12 kg/m2  SpO2 95%  Physical Exam  Nursing note and vitals reviewed. Constitutional: She is oriented to person, place, and time. She appears well-developed and well-nourished.  Cardiovascular: Normal rate and regular rhythm.   Pulmonary/Chest: Effort normal and breath sounds normal.  Musculoskeletal: Normal range of motion.  Neurological: She is oriented to person, place, and time.  Skin:  Pt has a superficial laceration to the right fifth digit:cut noted to the tip of finger nail:unable to lift the area    ED Course  LACERATION REPAIR Date/Time: 04/14/2013 9:47 PM Performed by: Teressa Lower Authorized by: Teressa Lower Consent: Verbal consent obtained. Consent given by: patient Time out: Immediately prior to procedure a "time out" was called to verify the correct patient, procedure, equipment, support staff and site/side marked as required. Body area: upper extremity Location details: right small finger Laceration length: 0.5 cm Foreign bodies: no foreign bodies Skin  closure: glue   (including critical care time)  Labs Reviewed - No data to display Dg Finger Little Left  04/14/2013  *RADIOLOGY REPORT*  Clinical Data: Left fifth digit laceration  LEFT LITTLE FINGER 2+V  Comparison: None.  Findings: No displaced fracture.  No dislocation.  No radiopaque foreign body.  IMPRESSION: No acute osseous finding or radiopaque foreign body.   Original Report Authenticated By: Jearld Lesch, M.D.      1. Finger laceration, initial encounter       MDM  Wound closed without any problem:tetanus is utd:pt is okay to follow up as needed        Teressa Lower, NP 04/14/13 2200

## 2013-04-14 NOTE — ED Notes (Signed)
Laceration to her left 5th digit while cutting an onion. Stopped the bleeding with a cobweb per pt. Bleeding controlled on arrival.

## 2013-05-23 ENCOUNTER — Encounter (HOSPITAL_BASED_OUTPATIENT_CLINIC_OR_DEPARTMENT_OTHER): Payer: Self-pay | Admitting: *Deleted

## 2013-05-23 ENCOUNTER — Emergency Department (HOSPITAL_BASED_OUTPATIENT_CLINIC_OR_DEPARTMENT_OTHER)
Admission: EM | Admit: 2013-05-23 | Discharge: 2013-05-24 | Disposition: A | Discharge status: Home / Self Care | Payer: Medicaid Other | Attending: Emergency Medicine | Admitting: Emergency Medicine

## 2013-05-23 DIAGNOSIS — Z8659 Personal history of other mental and behavioral disorders: Secondary | ICD-10-CM | POA: Insufficient documentation

## 2013-05-23 DIAGNOSIS — Z79899 Other long term (current) drug therapy: Secondary | ICD-10-CM | POA: Insufficient documentation

## 2013-05-23 DIAGNOSIS — Z87891 Personal history of nicotine dependence: Secondary | ICD-10-CM | POA: Insufficient documentation

## 2013-05-23 DIAGNOSIS — L02219 Cutaneous abscess of trunk, unspecified: Secondary | ICD-10-CM | POA: Insufficient documentation

## 2013-05-23 DIAGNOSIS — L02214 Cutaneous abscess of groin: Secondary | ICD-10-CM

## 2013-05-23 DIAGNOSIS — G43909 Migraine, unspecified, not intractable, without status migrainosus: Secondary | ICD-10-CM | POA: Insufficient documentation

## 2013-05-23 DIAGNOSIS — Z880 Allergy status to penicillin: Secondary | ICD-10-CM | POA: Insufficient documentation

## 2013-05-23 DIAGNOSIS — Z792 Long term (current) use of antibiotics: Secondary | ICD-10-CM | POA: Insufficient documentation

## 2013-05-23 MED ORDER — LIDOCAINE-EPINEPHRINE 2 %-1:100000 IJ SOLN
20.0000 mL | Freq: Once | INTRAMUSCULAR | Status: AC
  Administered 2013-05-24: 20 mL
  Filled 2013-05-23: qty 1

## 2013-05-23 MED ORDER — BUPIVACAINE HCL 0.25 % IJ SOLN
10.0000 mL | Freq: Once | INTRAMUSCULAR | Status: AC
  Administered 2013-05-24: 10 mL
  Filled 2013-05-23: qty 1

## 2013-05-23 NOTE — ED Notes (Signed)
Abscess on her right groin.

## 2013-05-24 NOTE — ED Provider Notes (Signed)
History     CSN: [id number]  Arrival date & time 05/23/13  2124   First MD Initiated Contact with Patient 05/23/13 2303      Chief Complaint  Patient presents with  . Abscess   HPI Lindsey Ruiz is a 32 y.o. female presents with a right inguinal abscess. Patient has had abscesses before. Started about 4 days ago, has gradually grown, associated with sharp, moderate to severe pain that is worse on pressing on the, is worse on ambulation or movement of the groin region. She's had no fevers, no chills, no lymphadenopathy, no dysuria, no frequency, vaginal discharge, no nausea, vomiting,, no abdominal pain, shortness of breath or chest pain.   Past Medical History  Diagnosis Date  . Migraine   . Anxiety     Past Surgical History  Procedure Laterality Date  . Breast lumpectomy    . Tonsillectomy and adenoidectomy    . Bunionectomy    . Breast cyst      No family history on file.  History  Substance Use Topics  . Smoking status: Former Games developer  . Smokeless tobacco: Not on file  . Alcohol Use: No    OB History   Grav Para Term Preterm Abortions TAB SAB Ect Mult Living                  Review of Systems At least 10pt or greater review of systems completed and are negative except where specified in the HPI.  Allergies  Penicillins and Sulfa antibiotics  Home Medications   Current Outpatient Rx  Name  Route  Sig  Dispense  Refill  . cyclobenzaprine (FLEXERIL) 10 MG tablet   Oral   Take 10 mg by mouth 3 (three) times daily as needed for muscle spasms.         . cephALEXin (KEFLEX) 500 MG capsule   Oral   Take 1 capsule (500 mg total) by mouth 4 (four) times daily.   40 capsule   0   . doxycycline (VIBRAMYCIN) 100 MG capsule   Oral   Take 1 capsule (100 mg total) by mouth 2 (two) times daily.   20 capsule   0   . HYDROcodone-acetaminophen (NORCO/VICODIN) 5-325 MG per tablet   Oral   Take 2 tablets by mouth every 4 (four) hours as needed for pain.   10  tablet   0   . Multiple Vitamins-Calcium (ONE-A-DAY WOMENS FORMULA PO)   Oral   Take 1 tablet by mouth daily.         . nitrofurantoin, macrocrystal-monohydrate, (MACROBID) 100 MG capsule   Oral   Take 1 capsule (100 mg total) by mouth 2 (two) times daily.   14 capsule   0   . phenazopyridine (PYRIDIUM) 200 MG tablet   Oral   Take 1 tablet (200 mg total) by mouth 3 (three) times daily.   6 tablet   0   . EXPIRED: promethazine (PHENERGAN) 12.5 MG tablet   Oral   Take 1 tablet (12.5 mg total) by mouth every 6 (six) hours as needed for nausea.   30 tablet   0   . EXPIRED: SUMAtriptan (IMITREX) 50 MG tablet   Oral   Take 1 tablet (50 mg total) by mouth every 2 (two) hours as needed for migraine (Do not take more than 200 mg in 24 hours).   10 tablet   0   . Topiramate (TOPAMAX PO)   Oral   Take by mouth.  BP 137/97  Pulse 81  Temp(Src) 98.2 F (36.8 C) (Oral)  Resp 20  Wt 184 lb (83.462 kg)  BMI 28.81 kg/m2  SpO2 100%  Physical Exam  Skin:       Nursing notes reviewed.  Electronic medical record reviewed. VITAL SIGNS:   Filed Vitals:   05/23/13 2138  BP: 137/97  Pulse: 81  Temp: 98.2 F (36.8 C)  TempSrc: Oral  Resp: 20  Weight: 184 lb (83.462 kg)  SpO2: 100%   CONSTITUTIONAL: Awake, oriented, appears non-toxic HENT: Atraumatic, normocephalic, oral mucosa pink and moist, airway patent. Nares patent without drainage. External ears normal. EYES: Conjunctiva clear, EOMI, PERRLA NECK: Trachea midline, non-tender, supple CARDIOVASCULAR: Normal heart rate, Normal rhythm, No murmurs, rubs, gallops PULMONARY/CHEST: Clear to auscultation, no rhonchi, wheezes, or rales. Symmetrical breath sounds. Non-tender. ABDOMINAL: Non-distended, soft, non-tender - no rebound or guarding.  BS normal. NEUROLOGIC: Non-focal, moving all four extremities, no gross sensory or motor deficits. EXTREMITIES: No clubbing, cyanosis, or edema. 2 cm x 3.5 cm fluctuant  mass in the right inguinal region, small amount of draining pus. SKIN: Warm, Dry, No erythema, No rash  ED Course  INCISION AND DRAINAGE Date/Time: 05/24/2013 6:26 AM Performed by: Jones Skene Authorized by: Jones Skene Consent: Verbal consent obtained. Consent given by: patient Type: abscess Body area: lower extremity (Right groin) Anesthesia: local infiltration Local anesthetic: lidocaine 2% with epinephrine and bupivacaine 0.25% without epinephrine Anesthetic total: 8 ml Patient sedated: no Scalpel size: 11 Incision type: single straight Complexity: complex Drainage: purulent Drainage amount: moderate Wound treatment: wound left open Patient tolerance: Patient tolerated the procedure well with no immediate complications.   (including critical care time)  Labs Reviewed - No data to display No results found.   1. Abscess of groin, right       MDM  Patient has abscess in the right inguinal region, likely from shaving, patient educated about shaving and increased likelihood that the patient will get future abscesses. Abscess incised and drained she tolerated the procedure well without complication. No surrounding cellulitis, patient is afebrile, nontoxic,  do not think this patient requires antibiotics at this time. Packing not indicated.   Return to the ER for any worsening symptoms such as fever, vomiting, chills, worsening pain or other concerning symptoms.  Return to her primary care provider for followup.         Jones Skene, MD 05/24/13 [phone]

## 2013-10-02 ENCOUNTER — Emergency Department (HOSPITAL_BASED_OUTPATIENT_CLINIC_OR_DEPARTMENT_OTHER)
Admission: EM | Admit: 2013-10-02 | Discharge: 2013-10-02 | Disposition: A | Discharge status: Home / Self Care | Payer: Medicaid Other | Attending: Emergency Medicine | Admitting: Emergency Medicine

## 2013-10-02 ENCOUNTER — Encounter (HOSPITAL_BASED_OUTPATIENT_CLINIC_OR_DEPARTMENT_OTHER): Payer: Self-pay | Admitting: Emergency Medicine

## 2013-10-02 ENCOUNTER — Emergency Department (HOSPITAL_BASED_OUTPATIENT_CLINIC_OR_DEPARTMENT_OTHER): Payer: Medicaid Other

## 2013-10-02 DIAGNOSIS — S59909A Unspecified injury of unspecified elbow, initial encounter: Secondary | ICD-10-CM | POA: Insufficient documentation

## 2013-10-02 DIAGNOSIS — Z87891 Personal history of nicotine dependence: Secondary | ICD-10-CM | POA: Insufficient documentation

## 2013-10-02 DIAGNOSIS — Z79899 Other long term (current) drug therapy: Secondary | ICD-10-CM | POA: Insufficient documentation

## 2013-10-02 DIAGNOSIS — S6990XA Unspecified injury of unspecified wrist, hand and finger(s), initial encounter: Secondary | ICD-10-CM | POA: Insufficient documentation

## 2013-10-02 DIAGNOSIS — M25552 Pain in left hip: Secondary | ICD-10-CM

## 2013-10-02 DIAGNOSIS — Z880 Allergy status to penicillin: Secondary | ICD-10-CM | POA: Insufficient documentation

## 2013-10-02 DIAGNOSIS — G43909 Migraine, unspecified, not intractable, without status migrainosus: Secondary | ICD-10-CM | POA: Insufficient documentation

## 2013-10-02 DIAGNOSIS — Y92009 Unspecified place in unspecified non-institutional (private) residence as the place of occurrence of the external cause: Secondary | ICD-10-CM | POA: Insufficient documentation

## 2013-10-02 DIAGNOSIS — S79919A Unspecified injury of unspecified hip, initial encounter: Secondary | ICD-10-CM | POA: Insufficient documentation

## 2013-10-02 DIAGNOSIS — Y939 Activity, unspecified: Secondary | ICD-10-CM | POA: Insufficient documentation

## 2013-10-02 DIAGNOSIS — Z8659 Personal history of other mental and behavioral disorders: Secondary | ICD-10-CM | POA: Insufficient documentation

## 2013-10-02 DIAGNOSIS — W010XXA Fall on same level from slipping, tripping and stumbling without subsequent striking against object, initial encounter: Secondary | ICD-10-CM | POA: Insufficient documentation

## 2013-10-02 DIAGNOSIS — W19XXXA Unspecified fall, initial encounter: Secondary | ICD-10-CM

## 2013-10-02 DIAGNOSIS — Z792 Long term (current) use of antibiotics: Secondary | ICD-10-CM | POA: Insufficient documentation

## 2013-10-02 DIAGNOSIS — M25532 Pain in left wrist: Secondary | ICD-10-CM

## 2013-10-02 MED ORDER — OXYCODONE-ACETAMINOPHEN 5-325 MG PO TABS
2.0000 | ORAL_TABLET | Freq: Once | ORAL | Status: AC
  Administered 2013-10-02: 2 via ORAL
  Filled 2013-10-02: qty 2

## 2013-10-02 MED ORDER — HYDROCODONE-ACETAMINOPHEN 5-325 MG PO TABS: 1.0000 | ORAL_TABLET | ORAL | Status: DC | PRN

## 2013-10-02 NOTE — ED Notes (Signed)
Tripped/fell approx 9am-pain left hip,left wrist

## 2013-10-02 NOTE — ED Provider Notes (Signed)
TIME SEEN: 4:40 PM  CHIEF COMPLAINT: Left wrist pain and hip pain  HPI: Patient is a 32 year old female with a history of migraines, anxiety who presents the emergency department after she had a mechanical fall at home today. She reports that she slipped on a piece of plastic that was on her floor and fell onto her left side. She did not hit her head passout. She has had left wrist pain and left hip pain since but has been able to ambulate. Denies any chest pain, shortness breath, palpitations or dizziness that caused her to fall. No numbness, tingling or focal weakness. She is right-hand dominant. She describes the pain is achy, diffuse over the left wrist and hip without radiation, moderate in nature, worse with movement, no alleviating factors.  ROS: See HPI Constitutional: no fever  Eyes: no drainage  ENT: no runny nose   Cardiovascular:  no chest pain  Resp: no SOB  GI: no vomiting GU: no dysuria Integumentary: no rash  Allergy: no hives  Musculoskeletal: no leg swelling  Neurological: no slurred speech ROS otherwise negative  PAST MEDICAL HISTORY/PAST SURGICAL HISTORY:  Past Medical History  Diagnosis Date  . Migraine   . Anxiety     MEDICATIONS:  Prior to Admission medications   Medication Sig Start Date End Date Taking? Authorizing Provider  FOLIC ACID PO Take by mouth.   Yes Historical Provider, MD  cephALEXin (KEFLEX) 500 MG capsule Take 1 capsule (500 mg total) by mouth 4 (four) times daily. 08/21/12   Elson Areas, PA-C  cyclobenzaprine (FLEXERIL) 10 MG tablet Take 10 mg by mouth 3 (three) times daily as needed for muscle spasms.    Historical Provider, MD  doxycycline (VIBRAMYCIN) 100 MG capsule Take 1 capsule (100 mg total) by mouth 2 (two) times daily. 08/21/12   Elson Areas, PA-C  HYDROcodone-acetaminophen (NORCO/VICODIN) 5-325 MG per tablet Take 2 tablets by mouth every 4 (four) hours as needed for pain. 08/21/12   Elson Areas, PA-C  Multiple Vitamins-Calcium  (ONE-A-DAY WOMENS FORMULA PO) Take 1 tablet by mouth daily.    Historical Provider, MD  nitrofurantoin, macrocrystal-monohydrate, (MACROBID) 100 MG capsule Take 1 capsule (100 mg total) by mouth 2 (two) times daily. 02/18/13   Teressa Lower, NP  phenazopyridine (PYRIDIUM) 200 MG tablet Take 1 tablet (200 mg total) by mouth 3 (three) times daily. 02/18/13   Teressa Lower, NP  promethazine (PHENERGAN) 12.5 MG tablet Take 1 tablet (12.5 mg total) by mouth every 6 (six) hours as needed for nausea. 04/26/12 05/03/12  Linward Headland, MD  SUMAtriptan (IMITREX) 50 MG tablet Take 1 tablet (50 mg total) by mouth every 2 (two) hours as needed for migraine (Do not take more than 200 mg in 24 hours). 04/26/12 04/26/13  Linward Headland, MD  Topiramate (TOPAMAX PO) Take by mouth.    Historical Provider, MD    ALLERGIES:  Allergies  Allergen Reactions  . Penicillins Hives and Swelling  . Sulfa Antibiotics Hives    SOCIAL HISTORY:  History  Substance Use Topics  . Smoking status: Former Games developer  . Smokeless tobacco: Not on file  . Alcohol Use: No    FAMILY HISTORY: No family history on file.  EXAM: BP 125/85  Pulse 90  Temp(Src) 99.2 F (37.3 C) (Oral)  Resp 16  Ht 5\' 7"  (1.702 m)  Wt 183 lb (83.008 kg)  BMI 28.66 kg/m2  SpO2 100% CONSTITUTIONAL: Alert and oriented and responds appropriately to questions. Well-appearing; well-nourished;  GCS 15 HEAD: Normocephalic; atraumatic EYES: Conjunctivae clear, PERRL, EOMI ENT: normal nose; no rhinorrhea; moist mucous membranes; pharynx without lesions noted; no dental injury; no septal hematoma NECK: Supple, no meningismus, no LAD; no midline spinal tenderness, step-off or deformity CARD: RRR; S1 and S2 appreciated; no murmurs, no clicks, no rubs, no gallops RESP: Normal chest excursion without splinting or tachypnea; breath sounds clear and equal bilaterally; no wheezes, no rhonchi, no rales; chest wall stable, nontender to palpation ABD/GI: Normal bowel  sounds; non-distended; soft, non-tender, no rebound, no guarding PELVIS:  stable, nontender to palpation BACK:  The back appears normal and is non-tender to palpation, there is no CVA tenderness; no midline spinal tenderness, step-off or deformity EXT: Tender to palpation over the left lateral hip without any obvious bony deformity, ecchymosis or swelling, no leg length discrepancy, no tenderness to palpation over the knee or proximal fibular head, left ankle or left foot, 2+ DP pulses bilaterally, sensation to light touch intact diffusely, patient does have some pain with internal/external rotation of the left hip but has full range of motion, patient is tender to palpation over the left wrist diffusely with no scaphoid tenderness, again no ecchymosis or bony deformity noted, 2+ radial pulses bilaterally, full range of motion in her left fingers, wrist, elbow and shoulder ; otherwise Normal ROM in all joints; non-tender to palpation; no edema; normal capillary refill; no cyanosis    SKIN: Normal color for age and race; warm NEURO: Moves all extremities equally PSYCH: The patient's mood and manner are appropriate. Grooming and personal hygiene are appropriate.  MEDICAL DECISION MAKING: Patient with mechanical fall with left wrist and hip pain. Will give pain medication and obtain x-rays.  ED PROGRESS: X-ray is unremarkable. We'll discharge home with crutches for comfort, pain medication. Will give orthopedic followup as needed. Given return precautions. Patient verbalizes understanding and is comfortable with plan.     Layla Maw Early Ord, DO 10/02/13 [phone]

## 2013-12-11 ENCOUNTER — Emergency Department (HOSPITAL_BASED_OUTPATIENT_CLINIC_OR_DEPARTMENT_OTHER)
Admission: EM | Admit: 2013-12-11 | Discharge: 2013-12-11 | Disposition: A | Discharge status: Home / Self Care | Payer: Medicaid Other | Attending: Emergency Medicine | Admitting: Emergency Medicine

## 2013-12-11 ENCOUNTER — Encounter (HOSPITAL_BASED_OUTPATIENT_CLINIC_OR_DEPARTMENT_OTHER): Payer: Self-pay | Admitting: Emergency Medicine

## 2013-12-11 DIAGNOSIS — Z79899 Other long term (current) drug therapy: Secondary | ICD-10-CM | POA: Insufficient documentation

## 2013-12-11 DIAGNOSIS — Z3202 Encounter for pregnancy test, result negative: Secondary | ICD-10-CM | POA: Insufficient documentation

## 2013-12-11 DIAGNOSIS — Z87891 Personal history of nicotine dependence: Secondary | ICD-10-CM | POA: Insufficient documentation

## 2013-12-11 DIAGNOSIS — Z880 Allergy status to penicillin: Secondary | ICD-10-CM | POA: Insufficient documentation

## 2013-12-11 DIAGNOSIS — F411 Generalized anxiety disorder: Secondary | ICD-10-CM | POA: Insufficient documentation

## 2013-12-11 DIAGNOSIS — N390 Urinary tract infection, site not specified: Secondary | ICD-10-CM | POA: Insufficient documentation

## 2013-12-11 DIAGNOSIS — G43909 Migraine, unspecified, not intractable, without status migrainosus: Secondary | ICD-10-CM | POA: Insufficient documentation

## 2013-12-11 LAB — URINALYSIS, ROUTINE W REFLEX MICROSCOPIC
Bilirubin Urine: NEGATIVE
Glucose, UA: NEGATIVE mg/dL
Ketones, ur: NEGATIVE mg/dL
Nitrite: NEGATIVE
Protein, ur: NEGATIVE mg/dL
Specific Gravity, Urine: 1.019 (ref 1.005–1.030)
Urobilinogen, UA: 0.2 mg/dL (ref 0.0–1.0)
pH: 6 (ref 5.0–8.0)

## 2013-12-11 LAB — WET PREP, GENITAL
CLUE CELLS WET PREP: NONE SEEN
TRICH WET PREP: NONE SEEN
YEAST WET PREP: NONE SEEN

## 2013-12-11 LAB — URINE MICROSCOPIC-ADD ON

## 2013-12-11 LAB — GC/CHLAMYDIA PROBE AMP
CT PROBE, AMP APTIMA: NEGATIVE
GC Probe RNA: NEGATIVE

## 2013-12-11 LAB — PREGNANCY, URINE: Preg Test, Ur: NEGATIVE

## 2013-12-11 MED ORDER — CIPROFLOXACIN HCL 500 MG PO TABS: 500.0000 mg | ORAL_TABLET | Freq: Two times a day (BID) | ORAL | Status: DC

## 2013-12-11 NOTE — ED Notes (Signed)
Pt states that a few minutes ago she developed a thick white discharge, no odor, denies itching,

## 2013-12-11 NOTE — ED Provider Notes (Signed)
CSN: [id number]     Arrival date & time 12/11/13  0355 History   First MD Initiated Contact with Patient 12/11/13 [phone]     Chief Complaint  Patient presents with  . Vaginal Discharge   (Consider location/radiation/quality/duration/timing/severity/associated sxs/prior Treatment) Patient is a 33 y.o. female presenting with vaginal discharge. The history is provided by the patient.  Vaginal Discharge Quality:  White Severity:  Mild Onset quality:  Sudden Duration: 15 minutes. Timing:  Constant Progression:  Unchanged Chronicity:  New Context: during urination   Context: not during pregnancy   Relieved by:  Nothing Worsened by:  Nothing tried Ineffective treatments:  None tried Associated symptoms: no abdominal pain, no dysuria, no fever, no rash, no urinary frequency, no vaginal itching and no vomiting   Risk factors: no new sexual partner     Past Medical History  Diagnosis Date  . Migraine   . Anxiety    Past Surgical History  Procedure Laterality Date  . Breast lumpectomy    . Tonsillectomy and adenoidectomy    . Bunionectomy    . Breast cyst     History reviewed. No pertinent family history. History  Substance Use Topics  . Smoking status: Former Research scientist (life sciences)  . Smokeless tobacco: Not on file  . Alcohol Use: No   OB History   Grav Para Term Preterm Abortions TAB SAB Ect Mult Living                 Review of Systems  Constitutional: Negative for fever.  Gastrointestinal: Negative for vomiting and abdominal pain.  Genitourinary: Positive for vaginal discharge. Negative for dysuria.  All other systems reviewed and are negative.    Allergies  Penicillins and Sulfa antibiotics  Home Medications   Current Outpatient Rx  Name  Route  Sig  Dispense  Refill  . cephALEXin (KEFLEX) 500 MG capsule   Oral   Take 1 capsule (500 mg total) by mouth 4 (four) times daily.   40 capsule   0   . cyclobenzaprine (FLEXERIL) 10 MG tablet   Oral   Take 10 mg by mouth 3  (three) times daily as needed for muscle spasms.         Marland Kitchen doxycycline (VIBRAMYCIN) 100 MG capsule   Oral   Take 1 capsule (100 mg total) by mouth 2 (two) times daily.   20 capsule   0   . FOLIC ACID PO   Oral   Take by mouth.         Marland Kitchen HYDROcodone-acetaminophen (NORCO/VICODIN) 5-325 MG per tablet   Oral   Take 2 tablets by mouth every 4 (four) hours as needed for pain.   10 tablet   0   . HYDROcodone-acetaminophen (NORCO/VICODIN) 5-325 MG per tablet   Oral   Take 1 tablet by mouth every 4 (four) hours as needed for pain.   15 tablet   0   . Multiple Vitamins-Calcium (ONE-A-DAY WOMENS FORMULA PO)   Oral   Take 1 tablet by mouth daily.         . nitrofurantoin, macrocrystal-monohydrate, (MACROBID) 100 MG capsule   Oral   Take 1 capsule (100 mg total) by mouth 2 (two) times daily.   14 capsule   0   . phenazopyridine (PYRIDIUM) 200 MG tablet   Oral   Take 1 tablet (200 mg total) by mouth 3 (three) times daily.   6 tablet   0   . EXPIRED: promethazine (PHENERGAN) 12.5 MG tablet  Oral   Take 1 tablet (12.5 mg total) by mouth every 6 (six) hours as needed for nausea.   30 tablet   0   . EXPIRED: SUMAtriptan (IMITREX) 50 MG tablet   Oral   Take 1 tablet (50 mg total) by mouth every 2 (two) hours as needed for migraine (Do not take more than 200 mg in 24 hours).   10 tablet   0   . Topiramate (TOPAMAX PO)   Oral   Take by mouth.          BP 160/96  Pulse 92  Temp(Src) 99.3 F (37.4 C) (Oral)  Resp 18  Ht 5\' 7"  (1.702 m)  Wt 183 lb (83.008 kg)  BMI 28.66 kg/m2  SpO2 100% Physical Exam  Constitutional: She is oriented to person, place, and time. She appears well-developed and well-nourished. No distress.  HENT:  Head: Normocephalic and atraumatic.  Mouth/Throat: Oropharynx is clear and moist. No oropharyngeal exudate.  Eyes: Conjunctivae are normal. Pupils are equal, round, and reactive to light.  Neck: Normal range of motion. Neck supple.   Cardiovascular: Normal rate and regular rhythm.   Pulmonary/Chest: Effort normal and breath sounds normal. She has no wheezes. She has no rales.  Abdominal: Soft. Bowel sounds are normal. There is no tenderness. There is no rebound and no guarding.  Genitourinary: Vaginal discharge found.  White discharge, no cmt nor adnexal tenderness.  Chaperone present  Musculoskeletal: Normal range of motion.  Neurological: She is alert and oriented to person, place, and time.  Skin: Skin is warm and dry.  Psychiatric: She has a normal mood and affect.    ED Course  Procedures (including critical care time) Labs Review Labs Reviewed  WET PREP, GENITAL  GC/CHLAMYDIA PROBE AMP  URINALYSIS, ROUTINE W REFLEX MICROSCOPIC   Imaging Review No results found.  EKG Interpretation   None       MDM  No diagnosis found. Pelvic exam benign, likely normal change in cervical mucus at this part of patient's cycle.  Will treat for UTI.  Follow up with your gynecologist for ongoing care    Ashvin Adelson K Malayzia Laforte-Rasch, MD 12/11/13 [phone]

## 2013-12-11 NOTE — ED Notes (Signed)
Pt denies any unprotected sex or new partners, last pap smear October 2014

## 2013-12-12 LAB — URINE CULTURE

## 2013-12-31 ENCOUNTER — Encounter (HOSPITAL_BASED_OUTPATIENT_CLINIC_OR_DEPARTMENT_OTHER): Payer: Self-pay | Admitting: Emergency Medicine

## 2013-12-31 ENCOUNTER — Emergency Department (HOSPITAL_BASED_OUTPATIENT_CLINIC_OR_DEPARTMENT_OTHER)
Admission: EM | Admit: 2013-12-31 | Discharge: 2013-12-31 | Disposition: A | Discharge status: Home / Self Care | Payer: Medicaid Other | Attending: Emergency Medicine | Admitting: Emergency Medicine

## 2013-12-31 DIAGNOSIS — Z8659 Personal history of other mental and behavioral disorders: Secondary | ICD-10-CM | POA: Insufficient documentation

## 2013-12-31 DIAGNOSIS — L0291 Cutaneous abscess, unspecified: Secondary | ICD-10-CM

## 2013-12-31 DIAGNOSIS — J029 Acute pharyngitis, unspecified: Secondary | ICD-10-CM

## 2013-12-31 DIAGNOSIS — L03119 Cellulitis of unspecified part of limb: Principal | ICD-10-CM

## 2013-12-31 DIAGNOSIS — L02419 Cutaneous abscess of limb, unspecified: Secondary | ICD-10-CM | POA: Insufficient documentation

## 2013-12-31 DIAGNOSIS — Z87891 Personal history of nicotine dependence: Secondary | ICD-10-CM | POA: Insufficient documentation

## 2013-12-31 DIAGNOSIS — Z880 Allergy status to penicillin: Secondary | ICD-10-CM | POA: Insufficient documentation

## 2013-12-31 DIAGNOSIS — G43909 Migraine, unspecified, not intractable, without status migrainosus: Secondary | ICD-10-CM | POA: Insufficient documentation

## 2013-12-31 DIAGNOSIS — Z792 Long term (current) use of antibiotics: Secondary | ICD-10-CM | POA: Insufficient documentation

## 2013-12-31 DIAGNOSIS — Z79899 Other long term (current) drug therapy: Secondary | ICD-10-CM | POA: Insufficient documentation

## 2013-12-31 DIAGNOSIS — Z9089 Acquired absence of other organs: Secondary | ICD-10-CM | POA: Insufficient documentation

## 2013-12-31 MED ORDER — CLINDAMYCIN HCL 150 MG PO CAPS: 450.0000 mg | ORAL_CAPSULE | Freq: Three times a day (TID) | ORAL | Status: DC

## 2013-12-31 NOTE — ED Provider Notes (Signed)
CSN: [id number]     Arrival date & time 12/31/13  9381 History   First MD Initiated Contact with Patient 12/31/13 [phone]     Chief Complaint  Patient presents with  . Abscess  . Sore Throat   (Consider location/radiation/quality/duration/timing/severity/associated sxs/prior Treatment) Patient is a 33 y.o. female presenting with abscess and pharyngitis. The history is provided by the patient.  Abscess Location:  Leg Leg abscess location:  R upper leg Size:  8 cm Abscess quality: fluctuance, painful, redness and warmth   Red streaking: no   Duration:  2 days Progression:  Worsening Associated symptoms: no fever   Sore Throat Pertinent negatives include no shortness of breath.    Past Medical History  Diagnosis Date  . Migraine   . Anxiety    Past Surgical History  Procedure Laterality Date  . Breast lumpectomy    . Tonsillectomy and adenoidectomy    . Bunionectomy    . Breast cyst     No family history on file. History  Substance Use Topics  . Smoking status: Former Research scientist (life sciences)  . Smokeless tobacco: Not on file  . Alcohol Use: No   OB History   Grav Para Term Preterm Abortions TAB SAB Ect Mult Living                 Review of Systems  Constitutional: Negative for fever.  Respiratory: Negative for cough and shortness of breath.   All other systems reviewed and are negative.    Allergies  Penicillins and Sulfa antibiotics  Home Medications   Current Outpatient Rx  Name  Route  Sig  Dispense  Refill  . cephALEXin (KEFLEX) 500 MG capsule   Oral   Take 1 capsule (500 mg total) by mouth 4 (four) times daily.   40 capsule   0   . ciprofloxacin (CIPRO) 500 MG tablet   Oral   Take 1 tablet (500 mg total) by mouth 2 (two) times daily. One po bid x 7 days   14 tablet   0   . cyclobenzaprine (FLEXERIL) 10 MG tablet   Oral   Take 10 mg by mouth 3 (three) times daily as needed for muscle spasms.         Marland Kitchen doxycycline (VIBRAMYCIN) 100 MG capsule   Oral   Take  1 capsule (100 mg total) by mouth 2 (two) times daily.   20 capsule   0   . FOLIC ACID PO   Oral   Take by mouth.         Marland Kitchen HYDROcodone-acetaminophen (NORCO/VICODIN) 5-325 MG per tablet   Oral   Take 2 tablets by mouth every 4 (four) hours as needed for pain.   10 tablet   0   . HYDROcodone-acetaminophen (NORCO/VICODIN) 5-325 MG per tablet   Oral   Take 1 tablet by mouth every 4 (four) hours as needed for pain.   15 tablet   0   . Multiple Vitamins-Calcium (ONE-A-DAY WOMENS FORMULA PO)   Oral   Take 1 tablet by mouth daily.         . nitrofurantoin, macrocrystal-monohydrate, (MACROBID) 100 MG capsule   Oral   Take 1 capsule (100 mg total) by mouth 2 (two) times daily.   14 capsule   0   . phenazopyridine (PYRIDIUM) 200 MG tablet   Oral   Take 1 tablet (200 mg total) by mouth 3 (three) times daily.   6 tablet   0   . EXPIRED:  promethazine (PHENERGAN) 12.5 MG tablet   Oral   Take 1 tablet (12.5 mg total) by mouth every 6 (six) hours as needed for nausea.   30 tablet   0   . EXPIRED: SUMAtriptan (IMITREX) 50 MG tablet   Oral   Take 1 tablet (50 mg total) by mouth every 2 (two) hours as needed for migraine (Do not take more than 200 mg in 24 hours).   10 tablet   0   . Topiramate (TOPAMAX PO)   Oral   Take by mouth.          BP 149/102  Pulse 104  Temp(Src) 98.8 F (37.1 C) (Oral)  Resp 20  SpO2 100% Physical Exam  Nursing note and vitals reviewed. Constitutional: She is oriented to person, place, and time. She appears well-developed and well-nourished. No distress.  HENT:  Head: Normocephalic and atraumatic.  Eyes: EOM are normal. Pupils are equal, round, and reactive to light.  Neck: Normal range of motion. Neck supple. No JVD present. No thyromegaly present.  No nodules in thyroid. Thyroid elevates symmetrically on exam  Cardiovascular: Normal rate and regular rhythm.  Exam reveals no friction rub.   No murmur heard. Pulmonary/Chest: Effort  normal and breath sounds normal. No respiratory distress. She has no wheezes. She has no rales.  Abdominal: Soft. She exhibits no distension. There is no tenderness. There is no rebound.  Genitourinary:     Musculoskeletal: Normal range of motion. She exhibits no edema.  Lymphadenopathy:    She has no cervical adenopathy.  Neurological: She is alert and oriented to person, place, and time. No cranial nerve deficit. She exhibits normal muscle tone.  Skin: No rash noted. She is not diaphoretic.    ED Course  INCISION AND DRAINAGE Date/Time: 12/31/2013 4:11 AM Performed by: Osvaldo Shipper Authorized by: Osvaldo Shipper Consent: Verbal consent obtained. Type: abscess Body area: lower extremity Location details: right leg Anesthesia: local infiltration Local anesthetic: lidocaine 1% without epinephrine Anesthetic total: 6 ml Patient sedated: no Risk factor: underlying major vessel Scalpel size: 11 Incision type: single straight Complexity: simple Drainage: purulent Drainage amount: copious Wound treatment: wound left open Packing material: 1/4 in iodoform gauze Patient tolerance: Patient tolerated the procedure well with no immediate complications.   (including critical care time) Labs Review Labs Reviewed - No data to display Imaging Review No results found.  EKG Interpretation   None       MDM   1. Abscess   2. Sore throat    33 year old female here with 2 complaints. #1 sore throat. Patient says with her before. Then the passer 4 days and is on her exterior trachea. Denies any odynophagia or dysphagia. Denies any scratchy feeling on the inside. Had this before and received a thyroid ultrasound. Here no swelling, thyroid elevates normally on exam with swallowing. No nodule or masses felt. Patient instructed to followup with her PCP for repeat ultrasound if deemed necessary. #2 abscess. Right inner thigh abscess. Proximal upper thigh medially. Mild  induration and cellulitis with fluctuance in the center. I&D as above. No systemic symptoms. Will put on antibiotics.    Osvaldo Shipper, MD 12/31/13 [phone]

## 2013-12-31 NOTE — ED Notes (Signed)
Pt c/o abscess to perineal area with no drainage and a sore throat no fever; denies n/v/d

## 2013-12-31 NOTE — Discharge Instructions (Signed)
Abscess An abscess is an infected area that contains a collection of pus and debris.It can occur in almost any part of the body. An abscess is also known as a furuncle or boil. CAUSES  An abscess occurs when tissue gets infected. This can occur from blockage of oil or sweat glands, infection of hair follicles, or a minor injury to the skin. As the body tries to fight the infection, pus collects in the area and creates pressure under the skin. This pressure causes pain. People with weakened immune systems have difficulty fighting infections and get certain abscesses more often.  SYMPTOMS Usually an abscess develops on the skin and becomes a painful mass that is red, warm, and tender. If the abscess forms under the skin, you may feel a moveable soft area under the skin. Some abscesses break open (rupture) on their own, but most will continue to get worse without care. The infection can spread deeper into the body and eventually into the bloodstream, causing you to feel ill.  DIAGNOSIS  Your caregiver will take your medical history and perform a physical exam. A sample of fluid may also be taken from the abscess to determine what is causing your infection. TREATMENT  Your caregiver may prescribe antibiotic medicines to fight the infection. However, taking antibiotics alone usually does not cure an abscess. Your caregiver may need to make a small cut (incision) in the abscess to drain the pus. In some cases, gauze is packed into the abscess to reduce pain and to continue draining the area. HOME CARE INSTRUCTIONS   Only take over-the-counter or prescription medicines for pain, discomfort, or fever as directed by your caregiver.  If you were prescribed antibiotics, take them as directed. Finish them even if you start to feel better.  If gauze is used, follow your caregiver's directions for changing the gauze.  To avoid spreading the infection:  Keep your draining abscess covered with a  bandage.  Wash your hands well.  Do not share personal care items, towels, or whirlpools with others.  Avoid skin contact with others.  Keep your skin and clothes clean around the abscess.  Keep all follow-up appointments as directed by your caregiver. SEEK MEDICAL CARE IF:   You have increased pain, swelling, redness, fluid drainage, or bleeding.  You have muscle aches, chills, or a general ill feeling.  You have a fever. MAKE SURE YOU:   Understand these instructions.  Will watch your condition.  Will get help right away if you are not doing well or get worse. Document Released: 08/30/2005 Document Revised: 05/21/2012 Document Reviewed: 02/02/2012 [employer] Patient Information 2014 [employer].  

## 2014-03-01 ENCOUNTER — Emergency Department (HOSPITAL_BASED_OUTPATIENT_CLINIC_OR_DEPARTMENT_OTHER)
Admission: EM | Admit: 2014-03-01 | Discharge: 2014-03-01 | Disposition: A | Discharge status: Home / Self Care | Payer: Medicaid Other | Attending: Emergency Medicine | Admitting: Emergency Medicine

## 2014-03-01 ENCOUNTER — Encounter (HOSPITAL_BASED_OUTPATIENT_CLINIC_OR_DEPARTMENT_OTHER): Payer: Self-pay | Admitting: Emergency Medicine

## 2014-03-01 DIAGNOSIS — Z87891 Personal history of nicotine dependence: Secondary | ICD-10-CM | POA: Insufficient documentation

## 2014-03-01 DIAGNOSIS — Z792 Long term (current) use of antibiotics: Secondary | ICD-10-CM | POA: Insufficient documentation

## 2014-03-01 DIAGNOSIS — Z79899 Other long term (current) drug therapy: Secondary | ICD-10-CM | POA: Insufficient documentation

## 2014-03-01 DIAGNOSIS — Z8659 Personal history of other mental and behavioral disorders: Secondary | ICD-10-CM | POA: Insufficient documentation

## 2014-03-01 DIAGNOSIS — Z880 Allergy status to penicillin: Secondary | ICD-10-CM | POA: Insufficient documentation

## 2014-03-01 DIAGNOSIS — G43909 Migraine, unspecified, not intractable, without status migrainosus: Secondary | ICD-10-CM

## 2014-03-01 MED ORDER — DIPHENHYDRAMINE HCL 50 MG/ML IJ SOLN
25.0000 mg | Freq: Once | INTRAMUSCULAR | Status: AC
  Administered 2014-03-01: 25 mg via INTRAVENOUS
  Filled 2014-03-01: qty 1

## 2014-03-01 MED ORDER — SODIUM CHLORIDE 0.9 % IV BOLUS (SEPSIS)
1000.0000 mL | Freq: Once | INTRAVENOUS | Status: AC
  Administered 2014-03-01: 1000 mL via INTRAVENOUS

## 2014-03-01 MED ORDER — METOCLOPRAMIDE HCL 5 MG/ML IJ SOLN
10.0000 mg | Freq: Once | INTRAMUSCULAR | Status: AC
  Administered 2014-03-01: 10 mg via INTRAVENOUS
  Filled 2014-03-01: qty 2

## 2014-03-01 NOTE — ED Provider Notes (Signed)
CSN: [id number]     Arrival date & time 03/01/14  1336 History   First MD Initiated Contact with Patient 03/01/14 1350     Chief Complaint  Patient presents with  . Headache     (Consider location/radiation/quality/duration/timing/severity/associated sxs/prior Treatment) HPI 33 year old female presents with a migraine headache. She gets migraines like this frequently. She states she gets these about 6-8 times per month. This current headache started yesterday. She states she used to be on Imitrex but now has been given Toradol IM to use as needed for her headaches. She took 60 mg IM yesterday with some partial relief. She also took ibuprofen once last night. However today the headache had gotten worse she sought treatment in the ER. Denies fevers, chills, neck stiffness, neck pain, or weakness. Vomited once yesterday and feels mildly nauseous now. Denies any blurry vision but has had photophobia. The headache is localized in her left for head above her eye. States this is where it always is. Discussed the pain is a sharp 7/10 pain.  Past Medical History  Diagnosis Date  . Migraine   . Anxiety    Past Surgical History  Procedure Laterality Date  . Breast lumpectomy    . Tonsillectomy and adenoidectomy    . Bunionectomy    . Breast cyst     No family history on file. History  Substance Use Topics  . Smoking status: Former Research scientist (life sciences)  . Smokeless tobacco: Not on file  . Alcohol Use: No   OB History   Grav Para Term Preterm Abortions TAB SAB Ect Mult Living                 Review of Systems  Constitutional: Negative for fever.  Eyes: Positive for photophobia. Negative for visual disturbance.  Gastrointestinal: Positive for nausea and vomiting. Negative for abdominal pain.  Genitourinary: Negative for menstrual problem.  Musculoskeletal: Negative for neck pain and neck stiffness.  Neurological: Positive for headaches. Negative for weakness and numbness.  All other systems reviewed  and are negative.      Allergies  Penicillins and Sulfa antibiotics  Home Medications   Current Outpatient Rx  Name  Route  Sig  Dispense  Refill  . ketorolac (TORADOL) 30 MG/ML injection   Intramuscular   Inject 60 mg into the muscle once.         . cephALEXin (KEFLEX) 500 MG capsule   Oral   Take 1 capsule (500 mg total) by mouth 4 (four) times daily.   40 capsule   0   . ciprofloxacin (CIPRO) 500 MG tablet   Oral   Take 1 tablet (500 mg total) by mouth 2 (two) times daily. One po bid x 7 days   14 tablet   0   . clindamycin (CLEOCIN) 150 MG capsule   Oral   Take 3 capsules (450 mg total) by mouth 3 (three) times daily.   90 capsule   0   . cyclobenzaprine (FLEXERIL) 10 MG tablet   Oral   Take 10 mg by mouth 3 (three) times daily as needed for muscle spasms.         Marland Kitchen doxycycline (VIBRAMYCIN) 100 MG capsule   Oral   Take 1 capsule (100 mg total) by mouth 2 (two) times daily.   20 capsule   0   . FOLIC ACID PO   Oral   Take by mouth.         Marland Kitchen HYDROcodone-acetaminophen (NORCO/VICODIN) 5-325 MG per tablet  Oral   Take 2 tablets by mouth every 4 (four) hours as needed for pain.   10 tablet   0   . HYDROcodone-acetaminophen (NORCO/VICODIN) 5-325 MG per tablet   Oral   Take 1 tablet by mouth every 4 (four) hours as needed for pain.   15 tablet   0   . Multiple Vitamins-Calcium (ONE-A-DAY WOMENS FORMULA PO)   Oral   Take 1 tablet by mouth daily.         . nitrofurantoin, macrocrystal-monohydrate, (MACROBID) 100 MG capsule   Oral   Take 1 capsule (100 mg total) by mouth 2 (two) times daily.   14 capsule   0   . phenazopyridine (PYRIDIUM) 200 MG tablet   Oral   Take 1 tablet (200 mg total) by mouth 3 (three) times daily.   6 tablet   0   . EXPIRED: promethazine (PHENERGAN) 12.5 MG tablet   Oral   Take 1 tablet (12.5 mg total) by mouth every 6 (six) hours as needed for nausea.   30 tablet   0   . EXPIRED: SUMAtriptan (IMITREX) 50 MG  tablet   Oral   Take 1 tablet (50 mg total) by mouth every 2 (two) hours as needed for migraine (Do not take more than 200 mg in 24 hours).   10 tablet   0   . Topiramate (TOPAMAX PO)   Oral   Take by mouth.          BP 148/98  Pulse 89  Temp(Src) 98.5 F (36.9 C) (Oral)  Resp 18  Ht 5\' 7"  (1.702 m)  Wt 183 lb (83.008 kg)  BMI 28.66 kg/m2  SpO2 100% Physical Exam  Nursing note and vitals reviewed. Constitutional: She is oriented to person, place, and time. She appears well-developed and well-nourished.  HENT:  Head: Normocephalic and atraumatic.  Right Ear: External ear normal.  Left Ear: External ear normal.  Nose: Nose normal.  Eyes: EOM are normal. Pupils are equal, round, and reactive to light. Right eye exhibits no discharge. Left eye exhibits no discharge.  Cardiovascular: Normal rate, regular rhythm and normal heart sounds.   Pulmonary/Chest: Effort normal and breath sounds normal.  Abdominal: Soft. There is no tenderness.  Neurological: She is alert and oriented to person, place, and time. She has normal strength. No cranial nerve deficit or sensory deficit. She exhibits normal muscle tone. Coordination normal. GCS eye subscore is 4. GCS verbal subscore is 5. GCS motor subscore is 6.  Reflex Scores:      Bicep reflexes are 2+ on the right side and 2+ on the left side.      Patellar reflexes are 2+ on the right side and 2+ on the left side. CN 2-12 grossly intact. 5/5 strength in all 4 extremities. Normal finger to nose testing.  Skin: Skin is warm and dry.    ED Course  Procedures (including critical care time) Labs Review Labs Reviewed - No data to display Imaging Review No results found.   EKG Interpretation None      MDM   Final diagnoses:  Migraine headache    Patient here with a recurrent migraine headache. There are no new alarming symptoms or signs concerning for more serious or different disease such as subarachnoid hemorrhage, infection,  stroke, or thrombosis. She is well-appearing here. Her neurologic exam is normal. After a headache cocktail she feels significantly improved. I cautioned her on using both toradol and ibuprofen as they are similar medications.  Ephraim Hamburger, MD 03/01/14 [phone]

## 2014-03-01 NOTE — ED Notes (Signed)
Reports onset of her typical migraine symptoms yesterday.  Took an injection of Toradol 60mg  IM at home without any relief of symptoms.  Reports eyes sensitive to light, nausea, vomiting.

## 2014-03-01 NOTE — Discharge Instructions (Signed)
Migraine Headache A migraine headache is an intense, throbbing pain on one or both sides of your head. A migraine can last for 30 minutes to several hours. CAUSES  The exact cause of a migraine headache is not always known. However, a migraine may be caused when nerves in the brain become irritated and release chemicals that cause inflammation. This causes pain. Certain things may also trigger migraines, such as:  Alcohol.  Smoking.  Stress.  Menstruation.  Aged cheeses.  Foods or drinks that contain nitrates, glutamate, aspartame, or tyramine.  Lack of sleep.  Chocolate.  Caffeine.  Hunger.  Physical exertion.  Fatigue.  Medicines used to treat chest pain (nitroglycerine), birth control pills, estrogen, and some blood pressure medicines. SIGNS AND SYMPTOMS  Pain on one or both sides of your head.  Pulsating or throbbing pain.  Severe pain that prevents daily activities.  Pain that is aggravated by any physical activity.  Nausea, vomiting, or both.  Dizziness.  Pain with exposure to bright lights, loud noises, or activity.  General sensitivity to bright lights, loud noises, or smells. Before you get a migraine, you may get warning signs that a migraine is coming (aura). An aura may include:  Seeing flashing lights.  Seeing bright spots, halos, or zig-zag lines.  Having tunnel vision or blurred vision.  Having feelings of numbness or tingling.  Having trouble talking.  Having muscle weakness. DIAGNOSIS  A migraine headache is often diagnosed based on:  Symptoms.  Physical exam.  A CT scan or MRI of your head. These imaging tests cannot diagnose migraines, but they can help rule out other causes of headaches. TREATMENT Medicines may be given for pain and nausea. Medicines can also be given to help prevent recurrent migraines.  HOME CARE INSTRUCTIONS  Only take over-the-counter or prescription medicines for pain or discomfort as directed by your  health care provider. The use of long-term narcotics is not recommended.  Lie down in a dark, quiet room when you have a migraine.  Keep a journal to find out what may trigger your migraine headaches. For example, write down:  What you eat and drink.  How much sleep you get.  Any change to your diet or medicines.  Limit alcohol consumption.  Quit smoking if you smoke.  Get 7 9 hours of sleep, or as recommended by your health care provider.  Limit stress.  Keep lights dim if bright lights bother you and make your migraines worse. SEEK IMMEDIATE MEDICAL CARE IF:   Your migraine becomes severe.  You have a fever.  You have a stiff neck.  You have vision loss.  You have muscular weakness or loss of muscle control.  You start losing your balance or have trouble walking.  You feel faint or pass out.  You have severe symptoms that are different from your first symptoms. MAKE SURE YOU:   Understand these instructions.  Will watch your condition.  Will get help right away if you are not doing well or get worse. Document Released: 11/20/2005 Document Revised: 09/10/2013 Document Reviewed: 07/28/2013 [employer] Patient Information 2014 [employer].  

## 2014-05-08 ENCOUNTER — Emergency Department (HOSPITAL_BASED_OUTPATIENT_CLINIC_OR_DEPARTMENT_OTHER)
Admission: EM | Admit: 2014-05-08 | Discharge: 2014-05-08 | Disposition: A | Discharge status: Home / Self Care | Payer: Medicaid Other | Attending: Emergency Medicine | Admitting: Emergency Medicine

## 2014-05-08 DIAGNOSIS — Z792 Long term (current) use of antibiotics: Secondary | ICD-10-CM | POA: Insufficient documentation

## 2014-05-08 DIAGNOSIS — Z79899 Other long term (current) drug therapy: Secondary | ICD-10-CM | POA: Insufficient documentation

## 2014-05-08 DIAGNOSIS — Z8659 Personal history of other mental and behavioral disorders: Secondary | ICD-10-CM | POA: Insufficient documentation

## 2014-05-08 DIAGNOSIS — J029 Acute pharyngitis, unspecified: Secondary | ICD-10-CM | POA: Insufficient documentation

## 2014-05-08 DIAGNOSIS — G43909 Migraine, unspecified, not intractable, without status migrainosus: Secondary | ICD-10-CM | POA: Insufficient documentation

## 2014-05-08 DIAGNOSIS — Z880 Allergy status to penicillin: Secondary | ICD-10-CM | POA: Insufficient documentation

## 2014-05-08 DIAGNOSIS — Z87891 Personal history of nicotine dependence: Secondary | ICD-10-CM | POA: Insufficient documentation

## 2014-05-08 LAB — RAPID STREP SCREEN (MED CTR MEBANE ONLY): Streptococcus, Group A Screen (Direct): NEGATIVE

## 2014-05-08 MED ORDER — IBUPROFEN 100 MG/5ML PO SUSP
800.0000 mg | Freq: Once | ORAL | Status: AC
Start: 2014-05-08 — End: 2014-05-08
  Administered 2014-05-08: 800 mg via ORAL
  Filled 2014-05-08: qty 40

## 2014-05-08 MED ORDER — DEXAMETHASONE 10 MG/ML FOR PEDIATRIC ORAL USE
10.0000 mg | Freq: Once | INTRAMUSCULAR | Status: AC
  Administered 2014-05-08: 10 mg via ORAL
  Filled 2014-05-08: qty 1

## 2014-05-08 MED ORDER — DEXAMETHASONE SODIUM PHOSPHATE 10 MG/ML IJ SOLN
INTRAMUSCULAR | Status: AC
  Administered 2014-05-08: 01:00:00 10 mg via ORAL
  Filled 2014-05-08: qty 1

## 2014-05-08 NOTE — ED Notes (Signed)
Pt. Reports sore throat.  Pain with swallowing.  Pt. Has no drooling noted in triage.

## 2014-05-08 NOTE — Discharge Instructions (Signed)
Gargle salt water for sore throat. Take ibuprofen and Tylenol for pain.  If you were given medicines take as directed.  If you are on coumadin or contraceptives realize their levels and effectiveness is altered by many different medicines.  If you have any reaction (rash, tongues swelling, other) to the medicines stop taking and see a physician.  Have blood pressure rechecked next week.  Please follow up as directed and return to the ER or see a physician for new or worsening symptoms.  Thank you. Filed Vitals:   05/08/14 0050  BP: 157/93  Pulse: 95  Temp: 98.3 F (36.8 C)  TempSrc: Oral  Resp: 17  Height: 5\' 7"  (1.702 m)  Weight: 183 lb (83.008 kg)  SpO2: 100%

## 2014-05-08 NOTE — ED Provider Notes (Signed)
CSN: [id number]     Arrival date & time 05/08/14  0040 History   First MD Initiated Contact with Patient 05/08/14 0058     Chief Complaint  Patient presents with  . Sore Throat     (Consider location/radiation/quality/duration/timing/severity/associated sxs/prior Treatment) HPI Comments: 33 year old female with anxiety and tonsillectomy/adenoidectomy history presents with worsening sore throat since Wednesday. No sick contacts or immunosuppression history. Pain with swallowing. No neck stiffness or fevers.  Patient is a 33 y.o. female presenting with pharyngitis. The history is provided by the patient.  Sore Throat Pertinent negatives include no headaches.    Past Medical History  Diagnosis Date  . Migraine   . Anxiety    Past Surgical History  Procedure Laterality Date  . Breast lumpectomy    . Tonsillectomy and adenoidectomy    . Bunionectomy    . Breast cyst     No family history on file. History  Substance Use Topics  . Smoking status: Former Research scientist (life sciences)  . Smokeless tobacco: Not on file  . Alcohol Use: No   OB History   Grav Para Term Preterm Abortions TAB SAB Ect Mult Living                 Review of Systems  Constitutional: Negative for fever and chills.  HENT: Positive for sore throat. Negative for drooling.   Gastrointestinal: Negative for vomiting.  Musculoskeletal: Negative for neck stiffness.  Neurological: Negative for headaches.      Allergies  Penicillins and Sulfa antibiotics  Home Medications   Prior to Admission medications   Medication Sig Start Date End Date Taking? Authorizing Provider  cephALEXin (KEFLEX) 500 MG capsule Take 1 capsule (500 mg total) by mouth 4 (four) times daily. 08/21/12   Fransico Meadow, PA-C  ciprofloxacin (CIPRO) 500 MG tablet Take 1 tablet (500 mg total) by mouth 2 (two) times daily. One po bid x 7 days 12/11/13   April K Palumbo-Rasch, MD  clindamycin (CLEOCIN) 150 MG capsule Take 3 capsules (450 mg total) by mouth 3  (three) times daily. 12/31/13   Osvaldo Shipper, MD  cyclobenzaprine (FLEXERIL) 10 MG tablet Take 10 mg by mouth 3 (three) times daily as needed for muscle spasms.    Historical Provider, MD  doxycycline (VIBRAMYCIN) 100 MG capsule Take 1 capsule (100 mg total) by mouth 2 (two) times daily. 08/21/12   Fransico Meadow, PA-C  FOLIC ACID PO Take by mouth.    Historical Provider, MD  HYDROcodone-acetaminophen (NORCO/VICODIN) 5-325 MG per tablet Take 2 tablets by mouth every 4 (four) hours as needed for pain. 08/21/12   Fransico Meadow, PA-C  HYDROcodone-acetaminophen (NORCO/VICODIN) 5-325 MG per tablet Take 1 tablet by mouth every 4 (four) hours as needed for pain. 10/02/13   Kristen N Ward, DO  ketorolac (TORADOL) 30 MG/ML injection Inject 60 mg into the muscle once.    Historical Provider, MD  Multiple Vitamins-Calcium (ONE-A-DAY WOMENS FORMULA PO) Take 1 tablet by mouth daily.    Historical Provider, MD  nitrofurantoin, macrocrystal-monohydrate, (MACROBID) 100 MG capsule Take 1 capsule (100 mg total) by mouth 2 (two) times daily. 02/18/13   Glendell Docker, NP  phenazopyridine (PYRIDIUM) 200 MG tablet Take 1 tablet (200 mg total) by mouth 3 (three) times daily. 02/18/13   Glendell Docker, NP  promethazine (PHENERGAN) 12.5 MG tablet Take 1 tablet (12.5 mg total) by mouth every 6 (six) hours as needed for nausea. 04/26/12 05/03/12  Hester Mates, MD  SUMAtriptan (IMITREX) 50  MG tablet Take 1 tablet (50 mg total) by mouth every 2 (two) hours as needed for migraine (Do not take more than 200 mg in 24 hours). 04/26/12 04/26/13  Hester Mates, MD  Topiramate (TOPAMAX PO) Take by mouth.    Historical Provider, MD   BP 157/93  Pulse 95  Temp(Src) 98.3 F (36.8 C) (Oral)  Resp 17  Ht 5\' 7"  (1.702 m)  Wt 183 lb (83.008 kg)  BMI 28.66 kg/m2  SpO2 100% Physical Exam  Nursing note and vitals reviewed. Constitutional: She appears well-developed and well-nourished. No distress.  HENT:  Head: Normocephalic and  atraumatic.  No trismus, uvular deviation, unilateral posterior pharyngeal edema or submandibular swelling. Mild posterior erythema  Eyes: Conjunctivae are normal.  Neck: Normal range of motion. Neck supple.  Cardiovascular: Normal rate.   Lymphadenopathy:    She has no cervical adenopathy.    ED Course  Procedures (including critical care time) Labs Review Labs Reviewed  RAPID STREP SCREEN  CULTURE, GROUP A STREP    Imaging Review No results found.   EKG Interpretation None      MDM   Final diagnoses:  Acute pharyngitis   Well-appearing female otherwise healthy with pharyngitis. Dexamethasone and ibuprofen ordered for pain and swelling. Strep test pending. No signs of emergent ENT infection, no signs of abscess. Strep neg.   Results and differential diagnosis were discussed with the patient/parent/guardian. Close follow up outpatient was discussed, comfortable with the plan.   Filed Vitals:   05/08/14 0050  BP: 157/93  Pulse: 95  Temp: 98.3 F (36.8 C)  TempSrc: Oral  Resp: 17  Height: 5\' 7"  (1.702 m)  Weight: 183 lb (83.008 kg)  SpO2: 100%       Mariea Clonts, MD 05/08/14 0130

## 2014-05-10 LAB — CULTURE, GROUP A STREP

## 2014-09-28 ENCOUNTER — Ambulatory Visit (INDEPENDENT_AMBULATORY_CARE_PROVIDER_SITE_OTHER): Payer: 59 | Admitting: Physician Assistant

## 2014-09-28 ENCOUNTER — Encounter: Payer: Self-pay | Admitting: Physician Assistant

## 2014-09-28 VITALS — BP 143/98 | HR 97 | Temp 98.7°F | Ht 67.0 in | Wt 186.0 lb

## 2014-09-28 DIAGNOSIS — G8929 Other chronic pain: Secondary | ICD-10-CM

## 2014-09-28 DIAGNOSIS — F419 Anxiety disorder, unspecified: Secondary | ICD-10-CM

## 2014-09-28 DIAGNOSIS — Z23 Encounter for immunization: Secondary | ICD-10-CM

## 2014-09-28 DIAGNOSIS — M5442 Lumbago with sciatica, left side: Secondary | ICD-10-CM

## 2014-09-28 DIAGNOSIS — F410 Panic disorder [episodic paroxysmal anxiety] without agoraphobia: Secondary | ICD-10-CM

## 2014-09-28 DIAGNOSIS — M549 Dorsalgia, unspecified: Secondary | ICD-10-CM

## 2014-09-28 MED ORDER — OXYCODONE-ACETAMINOPHEN 10-325 MG PO TABS: 1.0000 | ORAL_TABLET | Freq: Three times a day (TID) | ORAL | Status: DC | PRN

## 2014-09-28 MED ORDER — MELOXICAM 15 MG PO TABS: 15.0000 mg | ORAL_TABLET | Freq: Every day | ORAL | Status: DC

## 2014-09-28 MED ORDER — AMITRIPTYLINE HCL 10 MG PO TABS: 10.0000 mg | ORAL_TABLET | Freq: Every day | ORAL | Status: DC

## 2014-09-28 MED ORDER — PREDNISONE 50 MG PO TABS: ORAL_TABLET | ORAL | Status: DC

## 2014-09-28 MED ORDER — ALPRAZOLAM 1 MG PO TABS: 1.0000 mg | ORAL_TABLET | Freq: Two times a day (BID) | ORAL | Status: DC | PRN

## 2014-09-28 NOTE — Progress Notes (Signed)
Subjective:    Patient ID: Lindsey Ruiz, female    DOB: 15-Aug-1981, 33 y.o.   MRN: [id number]  HPI Pt is a 33 yo women who presents to the clinic to establish care and get medication refills.   .. Active Ambulatory Problems    Diagnosis Date Noted  . Panic attacks 09/28/2014  . Anxiety 09/28/2014  . Chronic back pain 09/28/2014   Resolved Ambulatory Problems    Diagnosis Date Noted  . No Resolved Ambulatory Problems   Past Medical History  Diagnosis Date  . Migraine    .Marland KitchenNo family history on file. .. History   Social History  . Marital Status: Single    Spouse Name: N/A    Number of Children: N/A  . Years of Education: N/A   Occupational History  . Not on file.   Social History Main Topics  . Smoking status: Former Research scientist (life sciences)  . Smokeless tobacco: Not on file  . Alcohol Use: No  . Drug Use: No  . Sexual Activity: Yes    Birth Control/ Protection: Implant     Comment: denies any uprotected sex or new partners   Other Topics Concern  . Not on file   Social History Narrative  . No narrative on file   Pt needs refill on xanax. December 2009 was her first panic attack in a crowded public place then her mother past away in feb 2010. Panic attacks occur about 1-4 a month. xananx helps a lot. Pt admits to some anxiety but does not feel like it is every day.   Pt has also had chronic back pain since 2011 when she was lifting a pt and they both fell together. Treated currently with norco 3 times a day. She feels like she has not had a full work up done. Pain is low back and radiating down to back of left knee some. Had 3 PT visits with no relief and chiropracter with no relieft. Takes ibuprofen occasionally. Denies any bowel or bladder dysfunction or saddle anesthesia.      Review of Systems  All other systems reviewed and are negative.      Objective:   Physical Exam  Constitutional: She is oriented to person, place, and time. She appears well-developed and  well-nourished.  HENT:  Head: Normocephalic and atraumatic.  Cardiovascular: Normal rate, regular rhythm and normal heart sounds.   Pulmonary/Chest: Effort normal and breath sounds normal. She has no wheezes.  Musculoskeletal:  Normal ROM at waist.  No pain over lumbar spine.  Pain over right and left SI joint.  Negative straight leg test.   Neurological: She is alert and oriented to person, place, and time.  Skin: Skin is dry.  Psychiatric: She has a normal mood and affect. Her behavior is normal.          Assessment & Plan:  Anxiety/panic attacks- GAD-7 was 8 today. Discussed daily SSRI. Pt did not want to start today. Refilled xanax to use as needed no more than twice a day. Will follow up in 2 weeks. Discussed abuse nature of xanax. Discussed with pt how SSRI can also help with pain.   Migraines- managed by neurology.   Chronic back pain/bilateral low back pain with left scatica- previously managed with norco. Discussed that we do not want to continue to mask pain. I would like to treat it. Pain seems to be over SI joint more left than right. Start with home exercises, burst of prednisone, mobic daily for 2 weeks,  and flexeril for muscle relaxer. i also added elavil at bedtime. Percocet was given but discussed to decrease from 3 to 2 a day and follow up in 2 weeks.    Flu shot given without complications.

## 2014-09-28 NOTE — Patient Instructions (Signed)
Low Back Sprain with Rehab  A sprain is an injury in which a ligament is torn. The ligaments of the lower back are vulnerable to sprains. However, they are strong and require great force to be injured. These ligaments are important for stabilizing the spinal column. Sprains are classified into three categories. Grade 1 sprains cause pain, but the tendon is not lengthened. Grade 2 sprains include a lengthened ligament, due to the ligament being stretched or partially ruptured. With grade 2 sprains there is still function, although the function may be decreased. Grade 3 sprains involve a complete tear of the tendon or muscle, and function is usually impaired. SYMPTOMS   Severe pain in the lower back.  Sometimes, a feeling of a "pop," "snap," or tear, at the time of injury.  Tenderness and sometimes swelling at the injury site.  Uncommonly, bruising (contusion) within 48 hours of injury.  Muscle spasms in the back. CAUSES  Low back sprains occur when a force is placed on the ligaments that is greater than they can handle. Common causes of injury include:  Performing a stressful act while off-balance.  Repetitive stressful activities that involve movement of the lower back.  Direct hit (trauma) to the lower back. RISK INCREASES WITH:  Contact sports (football, wrestling).  Collisions (major skiing accidents).  Sports that require throwing or lifting (baseball, weightlifting).  Sports involving twisting of the spine (gymnastics, diving, tennis, golf).  Poor strength and flexibility.  Inadequate protection.  Previous back injury or surgery (especially fusion). PREVENTION  Wear properly fitted and padded protective equipment.  Warm up and stretch properly before activity.  Allow for adequate recovery between workouts.  Maintain physical fitness:  Strength, flexibility, and endurance.  Cardiovascular fitness.  Maintain a healthy body weight. PROGNOSIS  If treated  properly, low back sprains usually heal with non-surgical treatment. The length of time for healing depends on the severity of the injury.  RELATED COMPLICATIONS   Recurring symptoms, resulting in a chronic problem.  Chronic inflammation and pain in the low back.  Delayed healing or resolution of symptoms, especially if activity is resumed too soon.  Prolonged impairment.  Unstable or arthritic joints of the low back. TREATMENT  Treatment first involves the use of ice and medicine, to reduce pain and inflammation. The use of strengthening and stretching exercises may help reduce pain with activity. These exercises may be performed at home or with a therapist. Severe injuries may require referral to a therapist for further evaluation and treatment, such as ultrasound. Your caregiver may advise that you wear a back brace or corset, to help reduce pain and discomfort. Often, prolonged bed rest results in greater harm then benefit. Corticosteroid injections may be recommended. However, these should be reserved for the most serious cases. It is important to avoid using your back when lifting objects. At night, sleep on your back on a firm mattress, with a pillow placed under your knees. If non-surgical treatment is unsuccessful, surgery may be needed.  MEDICATION   If pain medicine is needed, nonsteroidal anti-inflammatory medicines (aspirin and ibuprofen), or other minor pain relievers (acetaminophen), are often advised.  Do not take pain medicine for 7 days before surgery.  Prescription pain relievers may be given, if your caregiver thinks they are needed. Use only as directed and only as much as you need.  Ointments applied to the skin may be helpful.  Corticosteroid injections may be given by your caregiver. These injections should be reserved for the most serious cases,   because they may only be given a certain number of times. HEAT AND COLD  Cold treatment (icing) should be applied for 10  to 15 minutes every 2 to 3 hours for inflammation and pain, and immediately after activity that aggravates your symptoms. Use ice packs or an ice massage.  Heat treatment may be used before performing stretching and strengthening activities prescribed by your caregiver, physical therapist, or athletic trainer. Use a heat pack or a warm water soak. SEEK MEDICAL CARE IF:   Symptoms get worse or do not improve in 2 to 4 weeks, despite treatment.  You develop numbness or weakness in either leg.  You lose bowel or bladder function.  Any of the following occur after surgery: fever, increased pain, swelling, redness, drainage of fluids, or bleeding in the affected area.  New, unexplained symptoms develop. (Drugs used in treatment may produce side effects.) EXERCISES  RANGE OF MOTION (ROM) AND STRETCHING EXERCISES - Low Back Sprain Most people with lower back pain will find that their symptoms get worse with excessive bending forward (flexion) or arching at the lower back (extension). The exercises that will help resolve your symptoms will focus on the opposite motion.  Your physician, physical therapist or athletic trainer will help you determine which exercises will be most helpful to resolve your lower back pain. Do not complete any exercises without first consulting with your caregiver. Discontinue any exercises which make your symptoms worse, until you speak to your caregiver. If you have pain, numbness or tingling which travels down into your buttocks, leg or foot, the goal of the therapy is for these symptoms to move closer to your back and eventually resolve. Sometimes, these leg symptoms will get better, but your lower back pain may worsen. This is often an indication of progress in your rehabilitation. Be very alert to any changes in your symptoms and the activities in which you participated in the 24 hours prior to the change. Sharing this information with your caregiver will allow him or her to  most efficiently treat your condition. These exercises may help you when beginning to rehabilitate your injury. Your symptoms may resolve with or without further involvement from your physician, physical therapist or athletic trainer. While completing these exercises, remember:   Restoring tissue flexibility helps normal motion to return to the joints. This allows healthier, less painful movement and activity.  An effective stretch should be held for at least 30 seconds.  A stretch should never be painful. You should only feel a gentle lengthening or release in the stretched tissue. FLEXION RANGE OF MOTION AND STRETCHING EXERCISES: STRETCH - Flexion, Single Knee to Chest   Lie on a firm bed or floor with both legs extended in front of you.  Keeping one leg in contact with the floor, bring your opposite knee to your chest. Hold your leg in place by either grabbing behind your thigh or at your knee.  Pull until you feel a gentle stretch in your low back. Hold __________ seconds.  Slowly release your grasp and repeat the exercise with the opposite side. Repeat __________ times. Complete this exercise __________ times per day.  STRETCH - Flexion, Double Knee to Chest  Lie on a firm bed or floor with both legs extended in front of you.  Keeping one leg in contact with the floor, bring your opposite knee to your chest.  Tense your stomach muscles to support your back and then lift your other knee to your chest. Hold your legs   in place by either grabbing behind your thighs or at your knees.  Pull both knees toward your chest until you feel a gentle stretch in your low back. Hold __________ seconds.  Tense your stomach muscles and slowly return one leg at a time to the floor. Repeat __________ times. Complete this exercise __________ times per day.  STRETCH - Low Trunk Rotation  Lie on a firm bed or floor. Keeping your legs in front of you, bend your knees so they are both pointed toward the  ceiling and your feet are flat on the floor.  Extend your arms out to the side. This will stabilize your upper body by keeping your shoulders in contact with the floor.  Gently and slowly drop both knees together to one side until you feel a gentle stretch in your low back. Hold for __________ seconds.  Tense your stomach muscles to support your lower back as you bring your knees back to the starting position. Repeat the exercise to the other side. Repeat __________ times. Complete this exercise __________ times per day  EXTENSION RANGE OF MOTION AND FLEXIBILITY EXERCISES: STRETCH - Extension, Prone on Elbows   Lie on your stomach on the floor, a bed will be too soft. Place your palms about shoulder width apart and at the height of your head.  Place your elbows under your shoulders. If this is too painful, stack pillows under your chest.  Allow your body to relax so that your hips drop lower and make contact more completely with the floor.  Hold this position for __________ seconds.  Slowly return to lying flat on the floor. Repeat __________ times. Complete this exercise __________ times per day.  RANGE OF MOTION - Extension, Prone Press Ups  Lie on your stomach on the floor, a bed will be too soft. Place your palms about shoulder width apart and at the height of your head.  Keeping your back as relaxed as possible, slowly straighten your elbows while keeping your hips on the floor. You may adjust the placement of your hands to maximize your comfort. As you gain motion, your hands will come more underneath your shoulders.  Hold this position __________ seconds.  Slowly return to lying flat on the floor. Repeat __________ times. Complete this exercise __________ times per day.  RANGE OF MOTION- Quadruped, Neutral Spine   Assume a hands and knees position on a firm surface. Keep your hands under your shoulders and your knees under your hips. You may place padding under your knees for  comfort.  Drop your head and point your tailbone toward the ground below you. This will round out your lower back like an angry cat. Hold this position for __________ seconds.  Slowly lift your head and release your tail bone so that your back sags into a large arch, like an old horse.  Hold this position for __________ seconds.  Repeat this until you feel limber in your low back.  Now, find your "sweet spot." This will be the most comfortable position somewhere between the two previous positions. This is your neutral spine. Once you have found this position, tense your stomach muscles to support your low back.  Hold this position for __________ seconds. Repeat __________ times. Complete this exercise __________ times per day.  STRENGTHENING EXERCISES - Low Back Sprain These exercises may help you when beginning to rehabilitate your injury. These exercises should be done near your "sweet spot." This is the neutral, low-back arch, somewhere between fully rounded   and fully arched, that is your least painful position. When performed in this safe range of motion, these exercises can be used for people who have either a flexion or extension based injury. These exercises may resolve your symptoms with or without further involvement from your physician, physical therapist or athletic trainer. While completing these exercises, remember:   Muscles can gain both the endurance and the strength needed for everyday activities through controlled exercises.  Complete these exercises as instructed by your physician, physical therapist or athletic trainer. Increase the resistance and repetitions only as guided.  You may experience muscle soreness or fatigue, but the pain or discomfort you are trying to eliminate should never worsen during these exercises. If this pain does worsen, stop and make certain you are following the directions exactly. If the pain is still present after adjustments, discontinue the  exercise until you can discuss the trouble with your caregiver. STRENGTHENING - Deep Abdominals, Pelvic Tilt   Lie on a firm bed or floor. Keeping your legs in front of you, bend your knees so they are both pointed toward the ceiling and your feet are flat on the floor.  Tense your lower abdominal muscles to press your low back into the floor. This motion will rotate your pelvis so that your tail bone is scooping upwards rather than pointing at your feet or into the floor. With a gentle tension and even breathing, hold this position for __________ seconds. Repeat __________ times. Complete this exercise __________ times per day.  STRENGTHENING - Abdominals, Crunches   Lie on a firm bed or floor. Keeping your legs in front of you, bend your knees so they are both pointed toward the ceiling and your feet are flat on the floor. Cross your arms over your chest.  Slightly tip your chin down without bending your neck.  Tense your abdominals and slowly lift your trunk high enough to just clear your shoulder blades. Lifting higher can put excessive stress on the lower back and does not further strengthen your abdominal muscles.  Control your return to the starting position. Repeat __________ times. Complete this exercise __________ times per day.  STRENGTHENING - Quadruped, Opposite UE/LE Lift   Assume a hands and knees position on a firm surface. Keep your hands under your shoulders and your knees under your hips. You may place padding under your knees for comfort.  Find your neutral spine and gently tense your abdominal muscles so that you can maintain this position. Your shoulders and hips should form a rectangle that is parallel with the floor and is not twisted.  Keeping your trunk steady, lift your right hand no higher than your shoulder and then your left leg no higher than your hip. Make sure you are not holding your breath. Hold this position for __________ seconds.  Continuing to keep  your abdominal muscles tense and your back steady, slowly return to your starting position. Repeat with the opposite arm and leg. Repeat __________ times. Complete this exercise __________ times per day.  STRENGTHENING - Abdominals and Quadriceps, Straight Leg Raise   Lie on a firm bed or floor with both legs extended in front of you.  Keeping one leg in contact with the floor, bend the other knee so that your foot can rest flat on the floor.  Find your neutral spine, and tense your abdominal muscles to maintain your spinal position throughout the exercise.  Slowly lift your straight leg off the floor about 6 inches for a count   of 15, making sure to not hold your breath.  Still keeping your neutral spine, slowly lower your leg all the way to the floor. Repeat this exercise with each leg __________ times. Complete this exercise __________ times per day. POSTURE AND BODY MECHANICS CONSIDERATIONS - Low Back Sprain Keeping correct posture when sitting, standing or completing your activities will reduce the stress put on different body tissues, allowing injured tissues a chance to heal and limiting painful experiences. The following are general guidelines for improved posture. Your physician or physical therapist will provide you with any instructions specific to your needs. While reading these guidelines, remember:  The exercises prescribed by your provider will help you have the flexibility and strength to maintain correct postures.  The correct posture provides the best environment for your joints to work. All of your joints have less wear and tear when properly supported by a spine with good posture. This means you will experience a healthier, less painful body.  Correct posture must be practiced with all of your activities, especially prolonged sitting and standing. Correct posture is as important when doing repetitive low-stress activities (typing) as it is when doing a single heavy-load  activity (lifting). RESTING POSITIONS Consider which positions are most painful for you when choosing a resting position. If you have pain with flexion-based activities (sitting, bending, stooping, squatting), choose a position that allows you to rest in a less flexed posture. You would want to avoid curling into a fetal position on your side. If your pain worsens with extension-based activities (prolonged standing, working overhead), avoid resting in an extended position such as sleeping on your stomach. Most people will find more comfort when they rest with their spine in a more neutral position, neither too rounded nor too arched. Lying on a non-sagging bed on your side with a pillow between your knees, or on your back with a pillow under your knees will often provide some relief. Keep in mind, being in any one position for a prolonged period of time, no matter how correct your posture, can still lead to stiffness. PROPER SITTING POSTURE In order to minimize stress and discomfort on your spine, you must sit with correct posture. Sitting with good posture should be effortless for a healthy body. Returning to good posture is a gradual process. Many people can work toward this most comfortably by using various supports until they have the flexibility and strength to maintain this posture on their own. When sitting with proper posture, your ears will fall over your shoulders and your shoulders will fall over your hips. You should use the back of the chair to support your upper back. Your lower back will be in a neutral position, just slightly arched. You may place a small pillow or folded towel at the base of your lower back for  support.  When working at a desk, create an environment that supports good, upright posture. Without extra support, muscles tire, which leads to excessive strain on joints and other tissues. Keep these recommendations in mind: CHAIR:  A chair should be able to slide under your desk  when your back makes contact with the back of the chair. This allows you to work closely.  The chair's height should allow your eyes to be level with the upper part of your monitor and your hands to be slightly lower than your elbows. BODY POSITION  Your feet should make contact with the floor. If this is not possible, use a foot rest.  Keep your   ears over your shoulders. This will reduce stress on your neck and low back. INCORRECT SITTING POSTURES  If you are feeling tired and unable to assume a healthy sitting posture, do not slouch or slump. This puts excessive strain on your back tissues, causing more damage and pain. Healthier options include:  Using more support, like a lumbar pillow.  Switching tasks to something that requires you to be upright or walking.  Talking a brief walk.  Lying down to rest in a neutral-spine position. PROLONGED STANDING WHILE SLIGHTLY LEANING FORWARD  When completing a task that requires you to lean forward while standing in one place for a long time, place either foot up on a stationary 2-4 inch high object to help maintain the best posture. When both feet are on the ground, the lower back tends to lose its slight inward curve. If this curve flattens (or becomes too large), then the back and your other joints will experience too much stress, tire more quickly, and can cause pain. CORRECT STANDING POSTURES Proper standing posture should be assumed with all daily activities, even if they only take a few moments, like when brushing your teeth. As in sitting, your ears should fall over your shoulders and your shoulders should fall over your hips. You should keep a slight tension in your abdominal muscles to brace your spine. Your tailbone should point down to the ground, not behind your body, resulting in an over-extended swayback posture.  INCORRECT STANDING POSTURES  Common incorrect standing postures include a forward head, locked knees and/or an excessive  swayback. WALKING Walk with an upright posture. Your ears, shoulders and hips should all line-up. PROLONGED ACTIVITY IN A FLEXED POSITION When completing a task that requires you to bend forward at your waist or lean over a low surface, try to find a way to stabilize 3 out of 4 of your limbs. You can place a hand or elbow on your thigh or rest a knee on the surface you are reaching across. This will provide you more stability, so that your muscles do not tire as quickly. By keeping your knees relaxed, or slightly bent, you will also reduce stress across your lower back. CORRECT LIFTING TECHNIQUES DO :  Assume a wide stance. This will provide you more stability and the opportunity to get as close as possible to the object which you are lifting.  Tense your abdominals to brace your spine. Bend at the knees and hips. Keeping your back locked in a neutral-spine position, lift using your leg muscles. Lift with your legs, keeping your back straight.  Test the weight of unknown objects before attempting to lift them.  Try to keep your elbows locked down at your sides in order get the best strength from your shoulders when carrying an object.  Always ask for help when lifting heavy or awkward objects. INCORRECT LIFTING TECHNIQUES DO NOT:   Lock your knees when lifting, even if it is a small object.  Bend and twist. Pivot at your feet or move your feet when needing to change directions.  Assume that you can safely pick up even a paperclip without proper posture. Document Released: 11/20/2005 Document Revised: 02/12/2012 Document Reviewed: 03/04/2009 [employer] Patient Information 2015 [employer]. This information is not intended to replace advice given to you by your health care provider. Make sure you discuss any questions you have with your health care provider.  

## 2014-09-30 DIAGNOSIS — M5442 Lumbago with sciatica, left side: Secondary | ICD-10-CM | POA: Insufficient documentation

## 2014-10-12 ENCOUNTER — Ambulatory Visit: Payer: 59 | Admitting: Physician Assistant

## 2014-10-12 DIAGNOSIS — Z0289 Encounter for other administrative examinations: Secondary | ICD-10-CM

## 2014-10-28 ENCOUNTER — Ambulatory Visit: Payer: 59 | Admitting: Physician Assistant

## 2014-11-03 ENCOUNTER — Ambulatory Visit (INDEPENDENT_AMBULATORY_CARE_PROVIDER_SITE_OTHER): Payer: 59 | Admitting: Physician Assistant

## 2014-11-03 ENCOUNTER — Encounter: Payer: Self-pay | Admitting: Physician Assistant

## 2014-11-03 VITALS — BP 138/80 | HR 93 | Ht 67.0 in | Wt 189.0 lb

## 2014-11-03 DIAGNOSIS — F419 Anxiety disorder, unspecified: Secondary | ICD-10-CM

## 2014-11-03 DIAGNOSIS — M549 Dorsalgia, unspecified: Secondary | ICD-10-CM

## 2014-11-03 DIAGNOSIS — M5442 Lumbago with sciatica, left side: Secondary | ICD-10-CM

## 2014-11-03 DIAGNOSIS — G8929 Other chronic pain: Secondary | ICD-10-CM

## 2014-11-03 DIAGNOSIS — F410 Panic disorder [episodic paroxysmal anxiety] without agoraphobia: Secondary | ICD-10-CM

## 2014-11-03 MED ORDER — OXYCODONE-ACETAMINOPHEN 10-325 MG PO TABS: 1.0000 | ORAL_TABLET | Freq: Three times a day (TID) | ORAL | Status: DC | PRN

## 2014-11-03 MED ORDER — DULOXETINE HCL 30 MG PO CPEP: ORAL_CAPSULE | ORAL | Status: DC

## 2014-11-03 NOTE — Patient Instructions (Signed)
Try cymbalta and physical therapy.

## 2014-11-03 NOTE — Progress Notes (Signed)
   Subjective:    Patient ID: Lindsey Ruiz, female    DOB: 07/06/1981, 33 y.o.   MRN: [id number]  HPI  Patient presented to the clinic to follow-up on chronic back pain as well as anxiety and panic attacks.  Anxiety and panic attacks patient was given Xanax to use as needed for panic attacks. She says panic attacks have improved. She takes xanax as needed but not everyday. Her overall anxiety has not improved. At last visit she did not want to start medication daily. She has tried wellbutrin but can't remember why she stopped and pristiq made her blood pressure increase and was taken off.   Chronic back pain- pain is still localized over SI joint with occasional radiation down left leg. She reports to be doing exericises twice a day. She reports some improvement. She is taking mobic. She did feel like helped right after last visit with prednisone.    Review of Systems  All other systems reviewed and are negative.      Objective:   Physical Exam  Constitutional: She appears well-developed and well-nourished.  HENT:  Head: Normocephalic and atraumatic.  Cardiovascular: Normal rate, regular rhythm and normal heart sounds.   Pulmonary/Chest: Effort normal and breath sounds normal.  Musculoskeletal:  PInpoint pain over bilateral SI joint to palpation.  Negative straight leg test today.  ROM slow but normal at waist. Pt does report discomfort.   Psychiatric:  Flat affect.           Assessment & Plan:  Anxiety/panic attacks- discussed she needs to be on daily medication. GAD-7 was 12. Use xanax only as needed. Started cymbalta today. Discussed side effects. This could also help with pain. Follow up in 4-6 weeks.   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 She asks for her pain rx. I discussed with her I would give her a small quanity and she could use as needed but 30 should last her at least 2 months. If she is taking daily then she  needs to go to pain clinic. Continue to use elavil at bedtime, flexeril as needed, toradol injections or mobic. Hopefully cymbalta will help.   Spent 30 minutes with patient and greater than 50 percent spent discussing treatment plan.

## 2014-12-07 ENCOUNTER — Ambulatory Visit: Payer: 59 | Admitting: Rehabilitation

## 2014-12-21 ENCOUNTER — Ambulatory Visit: Payer: Medicaid Other | Attending: Physician Assistant

## 2015-04-09 DIAGNOSIS — G43909 Migraine, unspecified, not intractable, without status migrainosus: Secondary | ICD-10-CM | POA: Insufficient documentation

## 2015-04-09 DIAGNOSIS — I10 Essential (primary) hypertension: Secondary | ICD-10-CM | POA: Insufficient documentation

## 2015-05-26 ENCOUNTER — Encounter (INDEPENDENT_AMBULATORY_CARE_PROVIDER_SITE_OTHER): Payer: Self-pay

## 2015-05-26 ENCOUNTER — Ambulatory Visit (HOSPITAL_BASED_OUTPATIENT_CLINIC_OR_DEPARTMENT_OTHER)
Admission: RE | Admit: 2015-05-26 | Discharge: 2015-05-26 | Disposition: A | Discharge status: Home / Self Care | Payer: Commercial Managed Care - HMO | Source: Ambulatory Visit | Attending: Psychiatry | Admitting: Psychiatry

## 2015-05-26 ENCOUNTER — Other Ambulatory Visit (HOSPITAL_BASED_OUTPATIENT_CLINIC_OR_DEPARTMENT_OTHER): Payer: Self-pay | Admitting: Psychiatry

## 2015-05-26 DIAGNOSIS — M545 Low back pain: Secondary | ICD-10-CM | POA: Diagnosis present

## 2015-05-26 DIAGNOSIS — M7989 Other specified soft tissue disorders: Secondary | ICD-10-CM | POA: Diagnosis not present

## 2015-08-26 ENCOUNTER — Emergency Department (HOSPITAL_BASED_OUTPATIENT_CLINIC_OR_DEPARTMENT_OTHER)
Admission: EM | Admit: 2015-08-26 | Discharge: 2015-08-26 | Disposition: A | Discharge status: Home / Self Care | Payer: Commercial Managed Care - HMO | Attending: Emergency Medicine | Admitting: Emergency Medicine

## 2015-08-26 ENCOUNTER — Encounter (HOSPITAL_BASED_OUTPATIENT_CLINIC_OR_DEPARTMENT_OTHER): Payer: Self-pay

## 2015-08-26 DIAGNOSIS — N72 Inflammatory disease of cervix uteri: Secondary | ICD-10-CM | POA: Insufficient documentation

## 2015-08-26 DIAGNOSIS — Z87891 Personal history of nicotine dependence: Secondary | ICD-10-CM | POA: Diagnosis not present

## 2015-08-26 DIAGNOSIS — Z3202 Encounter for pregnancy test, result negative: Secondary | ICD-10-CM | POA: Insufficient documentation

## 2015-08-26 DIAGNOSIS — Z880 Allergy status to penicillin: Secondary | ICD-10-CM | POA: Insufficient documentation

## 2015-08-26 DIAGNOSIS — D419 Neoplasm of uncertain behavior of unspecified urinary organ: Secondary | ICD-10-CM | POA: Insufficient documentation

## 2015-08-26 DIAGNOSIS — G43909 Migraine, unspecified, not intractable, without status migrainosus: Secondary | ICD-10-CM | POA: Insufficient documentation

## 2015-08-26 DIAGNOSIS — N898 Other specified noninflammatory disorders of vagina: Secondary | ICD-10-CM | POA: Diagnosis present

## 2015-08-26 DIAGNOSIS — Z79899 Other long term (current) drug therapy: Secondary | ICD-10-CM | POA: Diagnosis not present

## 2015-08-26 LAB — WET PREP, GENITAL: Trich, Wet Prep: NONE SEEN

## 2015-08-26 LAB — URINE MICROSCOPIC-ADD ON

## 2015-08-26 LAB — URINALYSIS, ROUTINE W REFLEX MICROSCOPIC
Bilirubin Urine: NEGATIVE
Glucose, UA: NEGATIVE mg/dL
Ketones, ur: NEGATIVE mg/dL
Nitrite: NEGATIVE
PH: 6.5 (ref 5.0–8.0)
Protein, ur: NEGATIVE mg/dL
SPECIFIC GRAVITY, URINE: 1.013 (ref 1.005–1.030)
Urobilinogen, UA: 0.2 mg/dL (ref 0.0–1.0)

## 2015-08-26 LAB — PREGNANCY, URINE: Preg Test, Ur: NEGATIVE

## 2015-08-26 LAB — GC/CHLAMYDIA PROBE AMP (~~LOC~~) NOT AT ARMC
CHLAMYDIA, DNA PROBE: NEGATIVE
Neisseria Gonorrhea: NEGATIVE

## 2015-08-26 MED ORDER — DOXYCYCLINE HYCLATE 100 MG PO TABS
100.0000 mg | ORAL_TABLET | Freq: Once | ORAL | Status: AC
  Administered 2015-08-26: 100 mg via ORAL
  Filled 2015-08-26: qty 1

## 2015-08-26 MED ORDER — AZITHROMYCIN 1 G PO PACK
1.0000 g | PACK | Freq: Once | ORAL | Status: AC
  Administered 2015-08-26: 1 g via ORAL
  Filled 2015-08-26: qty 1

## 2015-08-26 MED ORDER — DOXYCYCLINE HYCLATE 100 MG PO CAPS: 100.0000 mg | ORAL_CAPSULE | Freq: Two times a day (BID) | ORAL | Status: DC

## 2015-08-26 NOTE — Discharge Instructions (Signed)
Cervicitis °Cervicitis is a soreness and swelling (inflammation) of the cervix. Your cervix is located at the bottom of your uterus. It opens up to the vagina. °CAUSES  °· Sexually transmitted infections (STIs).   °· Allergic reaction.   °· Medicines or birth control devices that are put in the vagina.   °· Injury to the cervix.   °· Bacterial infections.   °RISK FACTORS °You are at greater risk if you: °· Have unprotected sexual intercourse. °· Have sexual intercourse with many partners. °· Began sexual intercourse at an early age. °· Have a history of STIs. °SYMPTOMS  °There may be no symptoms. If symptoms occur, they may include:  °· Gray, white, yellow, or bad-smelling vaginal discharge.   °· Pain or itching of the area outside the vagina.   °· Painful sexual intercourse.   °· Lower abdominal or lower back pain, especially during intercourse.   °· Frequent urination.   °· Abnormal vaginal bleeding between periods, after sexual intercourse, or after menopause.   °· Pressure or a heavy feeling in the pelvis.   °DIAGNOSIS  °Diagnosis is made after a pelvic exam. Other tests may include:  °· Examination of any discharge under a microscope (wet prep).   °· A Pap test.   °TREATMENT  °Treatment will depend on the cause of cervicitis. If it is caused by an STI, both you and your partner will need to be treated. Antibiotic medicines will be given.  °HOME CARE INSTRUCTIONS  °· Do not have sexual intercourse until your health care provider says it is okay.   °· Do not have sexual intercourse until your partner has been treated, if your cervicitis is caused by an STI.   °· Take your antibiotics as directed. Finish them even if you start to feel better.   °SEEK MEDICAL CARE IF: °· Your symptoms come back.   °· You have a fever.   °MAKE SURE YOU:  °· Understand these instructions. °· Will watch your condition. °· Will get help right away if you are not doing well or get worse. °Document Released: 11/20/2005 Document Revised:  11/25/2013 Document Reviewed: 05/14/2013 °ExitCare® Patient Information ©2015 [employer]. This information is not intended to replace advice given to you by your health care provider. Make sure you discuss any questions you have with your health care provider. ° °

## 2015-08-26 NOTE — ED Provider Notes (Signed)
CSN: [id number]     Arrival date & time 08/26/15  0111 History   First MD Initiated Contact with Patient 08/26/15 0139     Chief Complaint  Patient presents with  . Vaginal Discharge     (Consider location/radiation/quality/duration/timing/severity/associated sxs/prior Treatment) Patient is a 34 y.o. female presenting with vaginal discharge. The history is provided by the patient.  Vaginal Discharge Quality:  [place] Severity:  Moderate Onset quality:  Gradual Timing:  Constant Progression:  Unchanged Chronicity:  New Context: not during pregnancy   Relieved by:  Nothing Worsened by:  Nothing tried Ineffective treatments:  None tried Associated symptoms: no abdominal pain and no vomiting   Risk factors: unprotected sex   Risk factors: no new sexual partner     Past Medical History  Diagnosis Date  . Migraine   . Anxiety    Past Surgical History  Procedure Laterality Date  . Breast lumpectomy    . Tonsillectomy and adenoidectomy    . Bunionectomy    . Breast cyst     History reviewed. No pertinent family history. Social History  Substance Use Topics  . Smoking status: Former Research scientist (life sciences)  . Smokeless tobacco: None  . Alcohol Use: No   OB History    No data available     Review of Systems  Gastrointestinal: Negative for vomiting and abdominal pain.  Genitourinary: Positive for vaginal discharge.  All other systems reviewed and are negative.     Allergies  Penicillins and Sulfa antibiotics  Home Medications   Prior to Admission medications   Medication Sig Start Date End Date Taking? Authorizing Provider  ALPRAZolam Duanne Moron) 1 MG tablet Take 1 tablet (1 mg total) by mouth 2 (two) times daily as needed for anxiety. 09/28/14   Jade L Breeback, PA-C  amitriptyline (ELAVIL) 10 MG tablet Take 1 tablet (10 mg total) by mouth at bedtime. 09/28/14   Jade L Breeback, PA-C  cyclobenzaprine (FLEXERIL) 10 MG tablet Take 10 mg by mouth 3 (three) times daily as needed for  muscle spasms.    Historical Provider, MD  doxycycline (VIBRAMYCIN) 100 MG capsule Take 1 capsule (100 mg total) by mouth 2 (two) times daily. One po bid x14 days 08/26/15   April Palumbo, MD  DULoxetine (CYMBALTA) 30 MG capsule Take one tablet for 7 days then increase to 2 tablets daily. 11/03/14   Jade L Breeback, PA-C  FOLIC ACID PO Take by mouth.    Historical Provider, MD  ketorolac (TORADOL) 30 MG/ML injection Inject 60 mg into the muscle once.    Historical Provider, MD  meloxicam (MOBIC) 15 MG tablet Take 1 tablet (15 mg total) by mouth daily. 09/28/14   Jade L Breeback, PA-C  Multiple Vitamins-Calcium (ONE-A-DAY WOMENS FORMULA PO) Take 1 tablet by mouth daily.    Historical Provider, MD  oxyCODONE-acetaminophen (PERCOCET) 10-325 MG per tablet Take 1 tablet by mouth every 8 (eight) hours as needed for pain. 11/03/14   Jade L Breeback, PA-C  promethazine (PHENERGAN) 12.5 MG tablet Take 1 tablet (12.5 mg total) by mouth every 6 (six) hours as needed for nausea. 04/26/12 05/03/12  Hester Mates, MD  SUMAtriptan (IMITREX) 50 MG tablet Take 1 tablet (50 mg total) by mouth every 2 (two) hours as needed for migraine (Do not take more than 200 mg in 24 hours). 04/26/12 04/26/13  Hester Mates, MD  topiramate (TOPAMAX) 100 MG tablet Take 100 mg by mouth 2 (two) times daily.    Historical Provider, MD  BP 155/108 mmHg  Pulse 99  Temp(Src) 98.8 F (37.1 C) (Oral)  Resp 18  Ht 5\' 8"  (1.727 m)  Wt 184 lb 4 oz (83.575 kg)  BMI 28.02 kg/m2  SpO2 100%  LMP 08/16/2015 (Approximate) Physical Exam  Constitutional: She is oriented to person, place, and time. She appears well-developed and well-nourished. No distress.  HENT:  Head: Normocephalic and atraumatic.  Mouth/Throat: Oropharynx is clear and moist.  Eyes: Conjunctivae are normal. Pupils are equal, round, and reactive to light.  Neck: Normal range of motion. Neck supple.  Cardiovascular: Normal rate, regular rhythm and intact distal pulses.    Pulmonary/Chest: Effort normal and breath sounds normal. No respiratory distress. She has no wheezes. She has no rales.  Abdominal: Soft. Bowel sounds are normal. There is no tenderness. There is no rebound.  Genitourinary: Cervix exhibits discharge.  Copious thin green discharge, chaperone present  Musculoskeletal: Normal range of motion.  Neurological: She is alert and oriented to person, place, and time.  Skin: Skin is warm and dry.  Psychiatric: She has a normal mood and affect.    ED Course  Procedures (including critical care time) Labs Review Labs Reviewed  WET PREP, GENITAL - Abnormal; Notable for the following:    Yeast Wet Prep HPF POC FEW (*)    Clue Cells Wet Prep HPF POC FEW (*)    WBC, Wet Prep HPF POC MODERATE (*)    All other components within normal limits  URINALYSIS, ROUTINE W REFLEX MICROSCOPIC (NOT AT [facility]) - Abnormal; Notable for the following:    APPearance CLOUDY (*)    Hgb urine dipstick SMALL (*)    Leukocytes, UA LARGE (*)    All other components within normal limits  URINE MICROSCOPIC-ADD ON - Abnormal; Notable for the following:    Squamous Epithelial / LPF FEW (*)    Bacteria, UA MANY (*)    All other components within normal limits  PREGNANCY, URINE  GC/CHLAMYDIA PROBE AMP ([place]) NOT AT [facility]    Imaging Review No results found. I have personally reviewed and evaluated these images and lab results as part of my medical decision-making.   EKG Interpretation None      MDM   Final diagnoses:  Cervicitis    Medications  doxycycline (VIBRA-TABS) tablet 100 mg (100 mg Oral Given 08/26/15 0252)  azithromycin (ZITHROMAX) powder 1 g (1 g Oral Given 08/26/15 0252)     Given the degree of discharge and the purulent appearance will treat for STIs.  Anaphylaxis to PCN, will treat with ongoing course of doxycyline.  No sexual activity of any kind until 7 days after all partners treated patient verbalizes understanding and agrees to follow  up    April Palumbo, MD 08/26/15 [phone]

## 2015-08-26 NOTE — ED Notes (Signed)
Pt verbalizes understanding of d/c instructions and denies any further needs at this time. 

## 2015-08-26 NOTE — ED Notes (Signed)
Brown vaginal discharge since Monday, no dysuria, no pain.

## 2015-10-22 ENCOUNTER — Other Ambulatory Visit: Payer: Self-pay

## 2015-10-22 ENCOUNTER — Encounter: Payer: Self-pay | Admitting: Podiatry

## 2015-10-22 ENCOUNTER — Ambulatory Visit (INDEPENDENT_AMBULATORY_CARE_PROVIDER_SITE_OTHER): Payer: Commercial Managed Care - HMO | Admitting: Podiatry

## 2015-10-22 VITALS — BP 155/101 | HR 93 | Ht 67.0 in | Wt 187.0 lb

## 2015-10-22 DIAGNOSIS — M21969 Unspecified acquired deformity of unspecified lower leg: Secondary | ICD-10-CM | POA: Diagnosis not present

## 2015-10-22 DIAGNOSIS — M79606 Pain in leg, unspecified: Secondary | ICD-10-CM | POA: Insufficient documentation

## 2015-10-22 DIAGNOSIS — M216X9 Other acquired deformities of unspecified foot: Secondary | ICD-10-CM | POA: Diagnosis not present

## 2015-10-22 MED ORDER — OXYCODONE-ACETAMINOPHEN 10-325 MG PO TABS: 1.0000 | ORAL_TABLET | Freq: Four times a day (QID) | ORAL | Status: DC | PRN

## 2015-10-22 NOTE — Progress Notes (Signed)
Subjective: 34 year old female presents complaining of pain on top of right foot at mid top and between the first and 2nd toe area for duration of many years R>L, the right foot pain is getting worse this year.  On feet at work constantly.  Systems review reveal negative other than the history of injury on right foot 2012, bilateral bunion surgery 2012. Migraine headache since 2004 and takes injection Toradol.  Objective: Dermatologic: Normal findings. Vascular: All pedal pulses are palpable. No edema or erythema noted. Neurologic: All epicritic and tactile sensations grossly intact. Pain at lateral ankle and mid dorsum left.  Orthopedic: Forefoot varus and excess STJ pronation bilateral.   X-ray of both feet show short first metatarsal bone bilateral, short first metatarsal, Post surgical removed medial metatarsal head.   Assessment: 1. Pain in ankle and mid foot left. 2. Short and elevated first ray bilateral. 3. STJ Hyperpronation bilateral.  Plan: Reviewed findings and available treatment options, change or adjust in shoe gear, therapeutic orthotics, change in activity, also surgical options if pain continues after conservative treatment. Patient will return for custom orthotics.

## 2015-10-22 NOTE — Patient Instructions (Signed)
Seen for bilateral foot pain R>L. X-rays taken and noted of short and elevated first metatarsal bone bilateral. Need custom orthotics. May benefit from Cotton osteotomy.

## 2015-10-24 IMAGING — CR DG HIP (WITH OR WITHOUT PELVIS) 2-3V*L*
3 series · 3 of 3 positions shown · non-contrast
Comparison: None.

CLINICAL DATA: Fall, left hip pain

EXAM:
LEFT HIP - COMPLETE 2+ VIEW

[t pelvis a.p.]
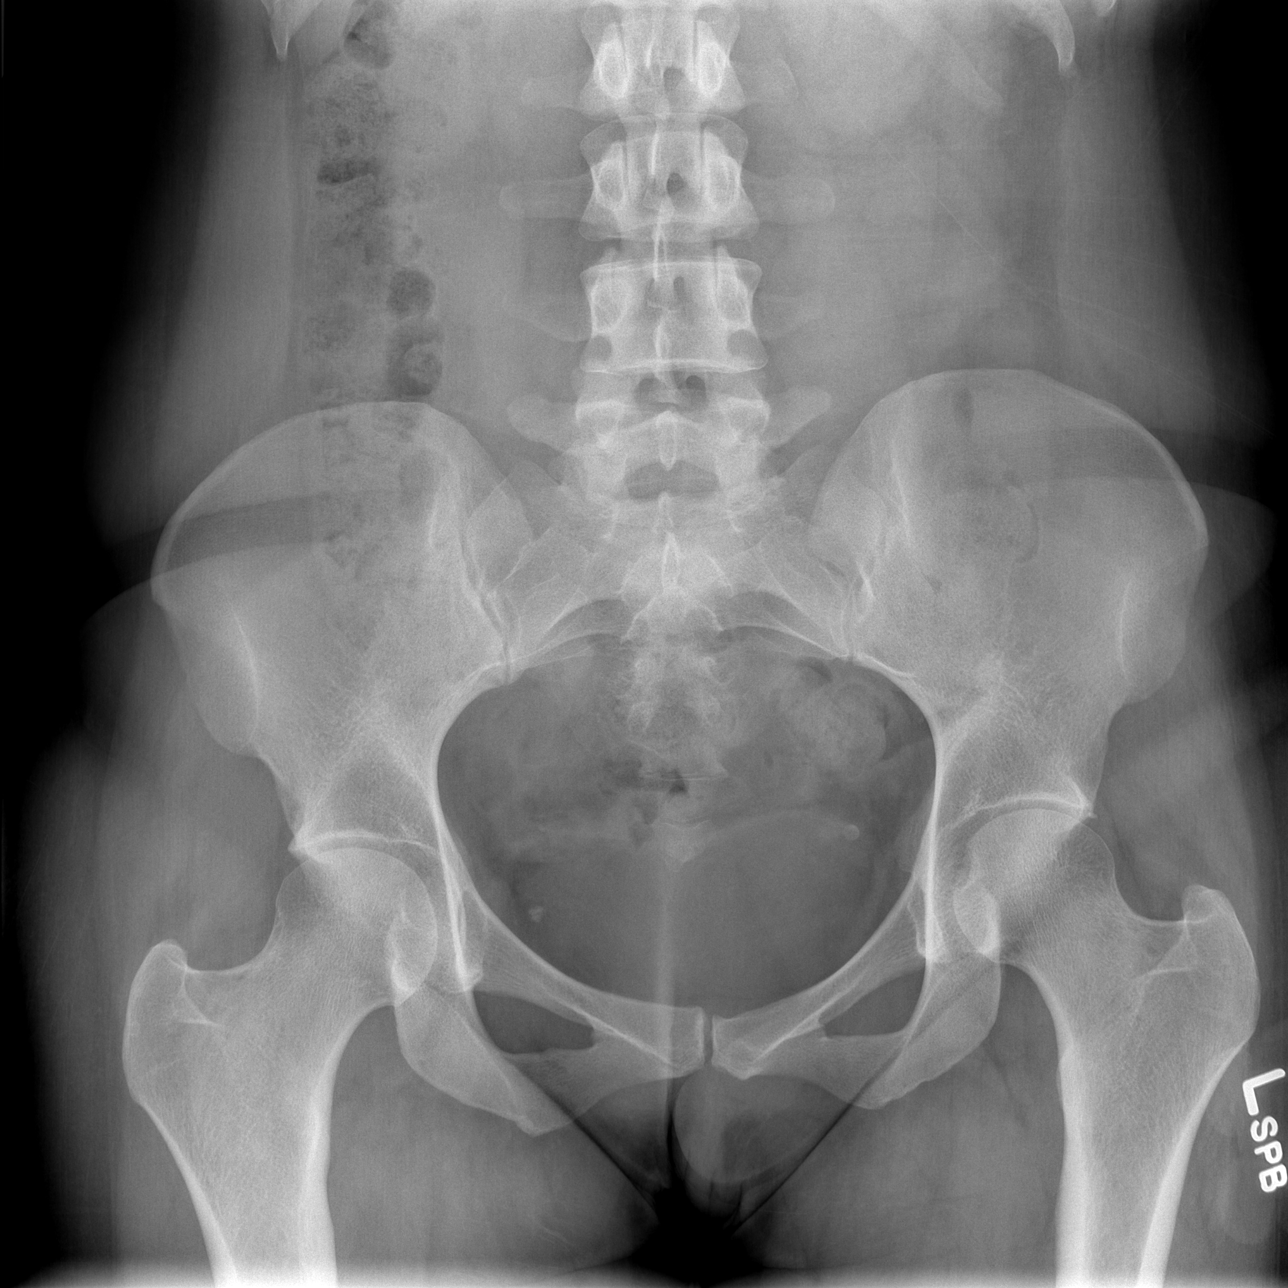

[t hip ap left]
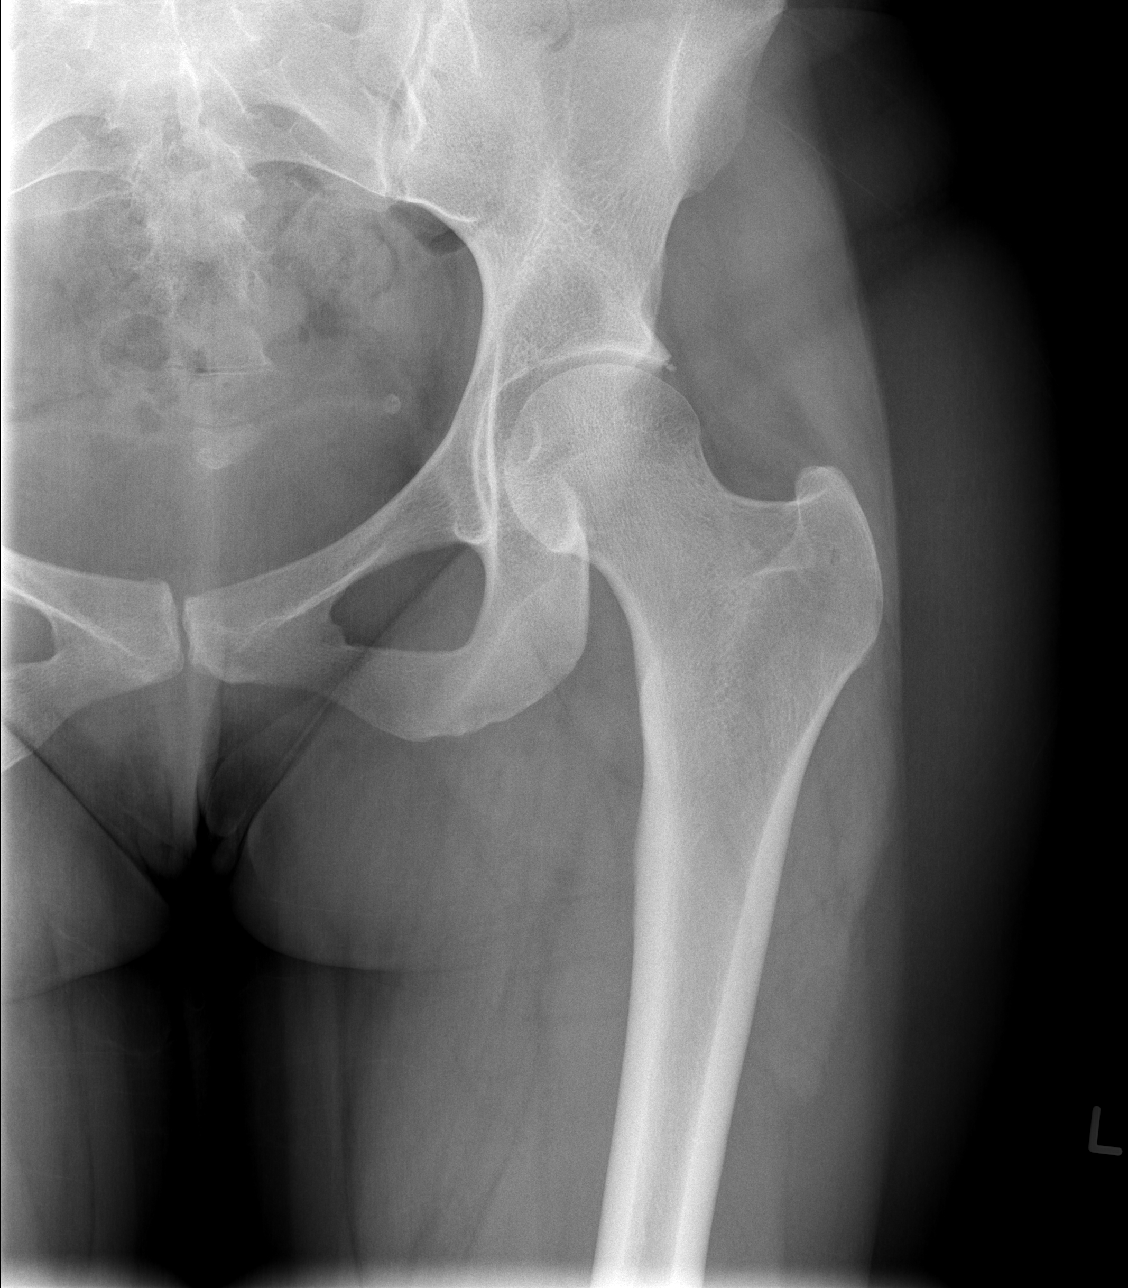

[t hip frog leg left]
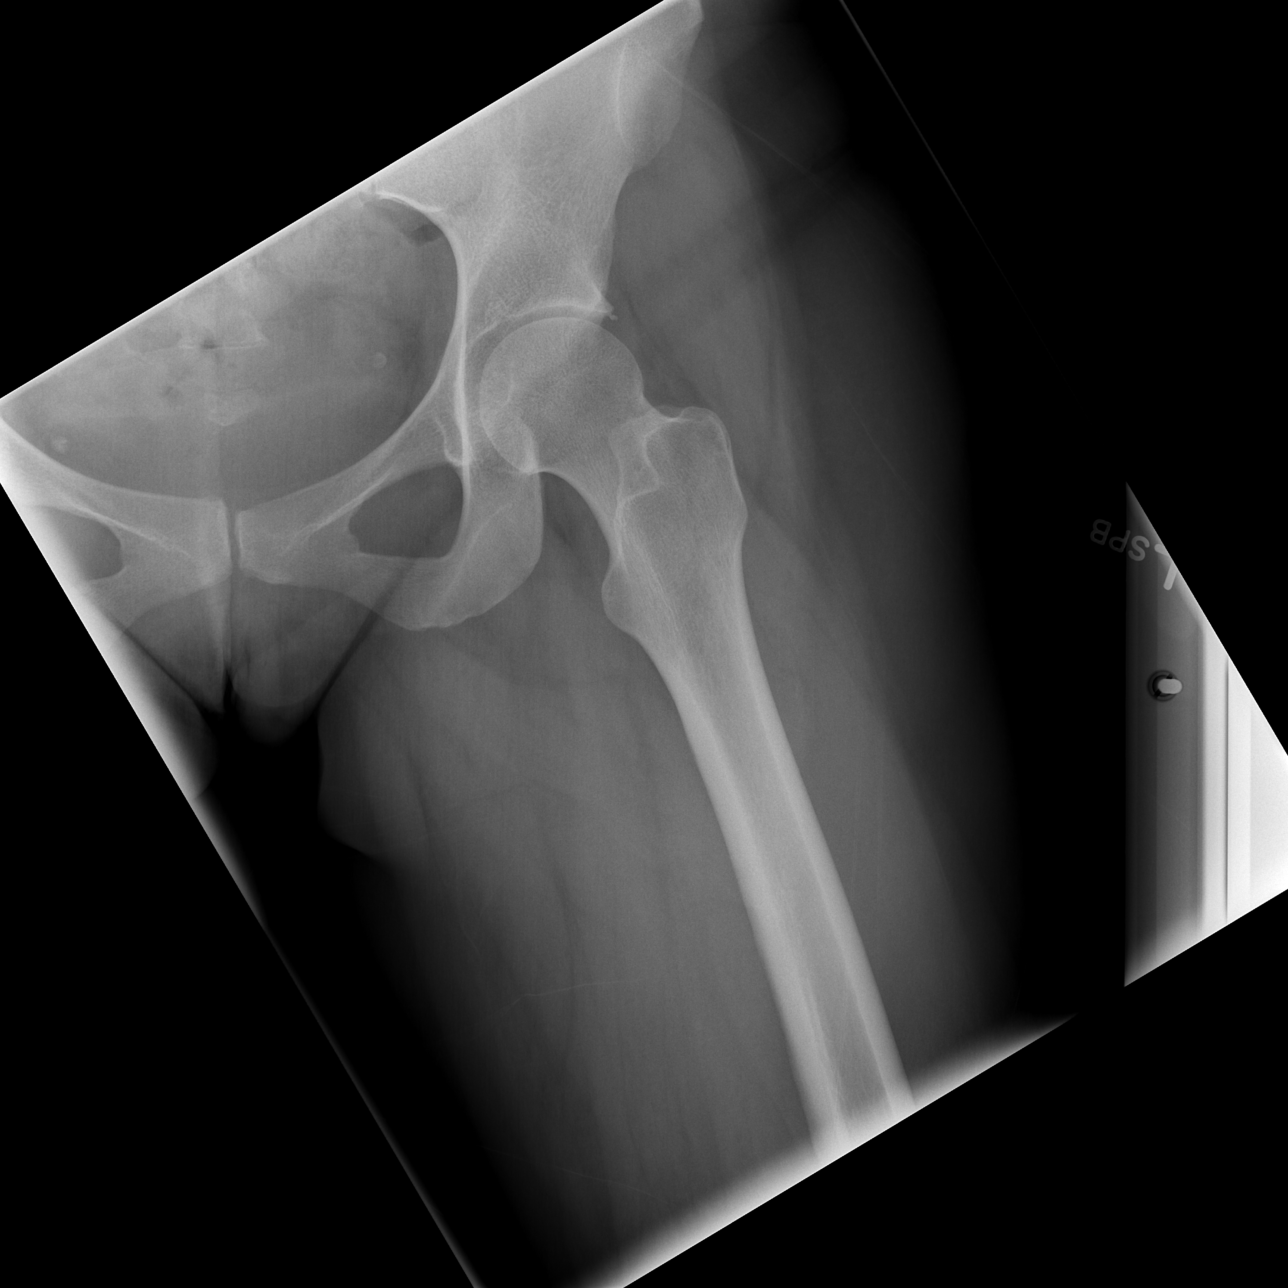

[3 of 3 positions shown; findings below may reference images not displayed]

FINDINGS: Three views of the left hip submitted. No acute fracture or
subluxation. No radiopaque foreign body. Bilateral hip joints are
symmetrical in appearance.
IMPRESSION: No acute fracture or subluxation.

## 2015-11-03 ENCOUNTER — Ambulatory Visit: Payer: Commercial Managed Care - HMO | Admitting: Podiatry

## 2015-11-11 ENCOUNTER — Encounter: Payer: Self-pay | Admitting: Podiatry

## 2015-11-11 ENCOUNTER — Ambulatory Visit (INDEPENDENT_AMBULATORY_CARE_PROVIDER_SITE_OTHER): Payer: Commercial Managed Care - HMO | Admitting: Podiatry

## 2015-11-11 VITALS — BP 149/102 | HR 80

## 2015-11-11 DIAGNOSIS — M65971 Unspecified synovitis and tenosynovitis, right ankle and foot: Secondary | ICD-10-CM | POA: Insufficient documentation

## 2015-11-11 DIAGNOSIS — M21969 Unspecified acquired deformity of unspecified lower leg: Secondary | ICD-10-CM | POA: Diagnosis not present

## 2015-11-11 DIAGNOSIS — M216X9 Other acquired deformities of unspecified foot: Secondary | ICD-10-CM

## 2015-11-11 DIAGNOSIS — M659 Synovitis and tenosynovitis, unspecified: Secondary | ICD-10-CM

## 2015-11-11 MED ORDER — OXYCODONE-ACETAMINOPHEN 10-325 MG PO TABS: 1.0000 | ORAL_TABLET | Freq: Four times a day (QID) | ORAL | Status: DC | PRN

## 2015-11-11 NOTE — Patient Instructions (Signed)
Both feet casted for orthotics. Surgery consent form reviewed for Cotton osteotomy with bone graft right foot. Will be in cast for 6 weeks in normal case.

## 2015-11-11 NOTE — Progress Notes (Signed)
Subjective: 34 year old female presents requesting custom orthotics and request for surgical options.   complaining of pain on top of right foot at mid top and between the first and 2nd toe area for duration of many years R>L, the right foot pain is getting worse this year.  On feet at work constantly.  Systems review reveal negative other than the history of injury on right foot 2012, bilateral bunion surgery 2012. Migraine headache since 2004 and takes injection Toradol.  Objective: Dermatologic: Normal findings. Vascular: All pedal pulses are palpable. No edema or erythema noted. Neurologic: All epicritic and tactile sensations grossly intact. Pain at lateral ankle and mid dorsum left.  Orthopedic: Forefoot varus and excess STJ pronation bilateral.   Previous x-ray report show short first metatarsal bone bilateral, short first metatarsal, Post surgical removed medial metatarsal head.   Assessment: 1. Pain in ankle and mid foot left. 2. Short and elevated first ray bilateral, post surgical. 3. STJ Hyperpronation bilateral.  Plan: Reviewed findings and available treatment options, change or adjust in shoe gear, therapeutic orthotics, change in activity, also surgical options if pain continues after conservative treatment. Both feet casted for orthotics. Surgery consent form reviewed for Cotton osteotomy with bone graft right foot.  Patient understands it would take about 2 months to heal.

## 2015-11-18 ENCOUNTER — Other Ambulatory Visit: Payer: Self-pay | Admitting: Podiatry

## 2015-11-18 MED ORDER — OXYCODONE-ACETAMINOPHEN 10-325 MG PO TABS: 1.0000 | ORAL_TABLET | Freq: Four times a day (QID) | ORAL | Status: DC | PRN

## 2015-11-18 MED ORDER — NABUMETONE 500 MG PO TABS: 500.0000 mg | ORAL_TABLET | Freq: Two times a day (BID) | ORAL | Status: DC

## 2015-11-19 DIAGNOSIS — M216X9 Other acquired deformities of unspecified foot: Secondary | ICD-10-CM

## 2015-11-19 HISTORY — PX: OTHER SURGICAL HISTORY: SHX169

## 2015-11-25 ENCOUNTER — Encounter: Payer: Self-pay | Admitting: Podiatry

## 2015-11-25 ENCOUNTER — Ambulatory Visit (INDEPENDENT_AMBULATORY_CARE_PROVIDER_SITE_OTHER): Payer: Commercial Managed Care - HMO | Admitting: Podiatry

## 2015-11-25 DIAGNOSIS — Z9889 Other specified postprocedural states: Secondary | ICD-10-CM

## 2015-11-25 NOTE — Progress Notes (Signed)
5 day S/P Cotton right foot, 11/19/15.  Doing well walking in cast. She is using scooter at home and wants to try return to work staying in scooter.

## 2015-11-25 NOTE — Patient Instructions (Signed)
Doing well. Ok to use scooter and return to work for 4 hours a day.

## 2015-12-09 ENCOUNTER — Encounter: Payer: Self-pay | Admitting: Podiatry

## 2015-12-09 ENCOUNTER — Ambulatory Visit (INDEPENDENT_AMBULATORY_CARE_PROVIDER_SITE_OTHER): Payer: Commercial Managed Care - HMO | Admitting: Podiatry

## 2015-12-09 ENCOUNTER — Inpatient Hospital Stay: Admission: RE | Admit: 2015-12-09 | Payer: Self-pay | Source: Ambulatory Visit

## 2015-12-09 DIAGNOSIS — M79673 Pain in unspecified foot: Secondary | ICD-10-CM

## 2015-12-09 DIAGNOSIS — M21969 Unspecified acquired deformity of unspecified lower leg: Secondary | ICD-10-CM

## 2015-12-09 DIAGNOSIS — M79604 Pain in right leg: Secondary | ICD-10-CM

## 2015-12-09 DIAGNOSIS — M21961 Unspecified acquired deformity of right lower leg: Secondary | ICD-10-CM

## 2015-12-09 MED ORDER — NABUMETONE 500 MG PO TABS: 500.0000 mg | ORAL_TABLET | Freq: Two times a day (BID) | ORAL | Status: DC

## 2015-12-09 MED ORDER — OXYCODONE-ACETAMINOPHEN 10-325 MG PO TABS: 1.0000 | ORAL_TABLET | Freq: Four times a day (QID) | ORAL | Status: DC | PRN

## 2015-12-09 NOTE — Progress Notes (Signed)
3 weeks S/P Cotton right foot, 11/19/15.  Doing well walking in cast. She is using scooter at home Been to work 4 hours a day and managing well.   Post op X-ray of right foot show, good graft position and good approximation. No abnormal findings noted. Right lower limb placed back in new fiberglass cast with cast boot. Continue with scooter to get around.  May increase work hours, desk work, as tolerated. Return in 3 weeks.

## 2015-12-15 ENCOUNTER — Ambulatory Visit (INDEPENDENT_AMBULATORY_CARE_PROVIDER_SITE_OTHER): Payer: Commercial Managed Care - HMO | Admitting: Podiatry

## 2015-12-15 ENCOUNTER — Encounter: Payer: Self-pay | Admitting: Podiatry

## 2015-12-15 DIAGNOSIS — Z9889 Other specified postprocedural states: Secondary | ICD-10-CM

## 2015-12-15 NOTE — Progress Notes (Signed)
Patient returned with cracked cast at the middle of leg. Foot part of cast is firm and secure. Cracked cast repaired with another roll of fiberglass cast. Will remove cast in 2 weeks. Patient continues with 4 hours a day work without problem.

## 2015-12-15 NOTE — Patient Instructions (Signed)
Seen for cracked cast and repaired. OK to return to work tomorrow. Return on scheduled date.

## 2015-12-30 ENCOUNTER — Ambulatory Visit (INDEPENDENT_AMBULATORY_CARE_PROVIDER_SITE_OTHER): Payer: Commercial Managed Care - HMO | Admitting: Podiatry

## 2015-12-30 ENCOUNTER — Encounter: Payer: Self-pay | Admitting: Podiatry

## 2015-12-30 ENCOUNTER — Inpatient Hospital Stay: Admission: RE | Admit: 2015-12-30 | Payer: Self-pay | Source: Ambulatory Visit

## 2015-12-30 DIAGNOSIS — M216X1 Other acquired deformities of right foot: Secondary | ICD-10-CM

## 2015-12-30 DIAGNOSIS — M216X9 Other acquired deformities of unspecified foot: Secondary | ICD-10-CM | POA: Diagnosis not present

## 2015-12-30 DIAGNOSIS — M79604 Pain in right leg: Secondary | ICD-10-CM

## 2015-12-30 DIAGNOSIS — M21969 Unspecified acquired deformity of unspecified lower leg: Secondary | ICD-10-CM | POA: Diagnosis not present

## 2015-12-30 DIAGNOSIS — M21961 Unspecified acquired deformity of right lower leg: Secondary | ICD-10-CM

## 2015-12-30 MED ORDER — OXYCODONE-ACETAMINOPHEN 10-325 MG PO TABS: 1.0000 | ORAL_TABLET | Freq: Four times a day (QID) | ORAL | Status: DC | PRN

## 2015-12-30 NOTE — Progress Notes (Signed)
Subjective: 6 weeks post op Rt Cotton 11/19/15. Patient experiencing some pain at the anterior aspect of ankle joint right under the cast.  Cast removed. Wound healing is good with no edema or erythema.   6 weeks Post op X-ray following Cotton osteotomy with bone graft show good bone to graft approximation. Plantar flexed first ray noted. No abnormal findings with satisfactory positional correction of the first ray and good graft position.   Assessment: Satisfactory progress.  Plan: Patient right lower limb placed in CAM walker. Patient is to limit ambulation and can increase work hour to 6-8 hours/day.  Return in 3 weeks.

## 2015-12-30 NOTE — Patient Instructions (Signed)
Doing well following 6 weeks post op. Cast removed. X-ray normal. Placed in CAM walker. Limit ambulation.  May take pain medication if needed. Ok to increase work hour to 6-8 hours/shift as tolerated. Return in 3 weeks.

## 2016-01-12 ENCOUNTER — Encounter: Payer: Commercial Managed Care - HMO | Admitting: Certified Nurse Midwife

## 2016-01-20 ENCOUNTER — Encounter: Payer: Self-pay | Admitting: Certified Nurse Midwife

## 2016-01-20 ENCOUNTER — Ambulatory Visit (INDEPENDENT_AMBULATORY_CARE_PROVIDER_SITE_OTHER): Payer: Commercial Managed Care - HMO | Admitting: Certified Nurse Midwife

## 2016-01-20 ENCOUNTER — Ambulatory Visit (INDEPENDENT_AMBULATORY_CARE_PROVIDER_SITE_OTHER): Payer: Commercial Managed Care - HMO | Admitting: Podiatry

## 2016-01-20 ENCOUNTER — Encounter: Payer: Self-pay | Admitting: Podiatry

## 2016-01-20 VITALS — BP 120/82 | HR 70 | Resp 18 | Ht 68.0 in | Wt 179.0 lb

## 2016-01-20 DIAGNOSIS — A499 Bacterial infection, unspecified: Secondary | ICD-10-CM | POA: Diagnosis not present

## 2016-01-20 DIAGNOSIS — B373 Candidiasis of vulva and vagina: Secondary | ICD-10-CM | POA: Diagnosis not present

## 2016-01-20 DIAGNOSIS — R8290 Unspecified abnormal findings in urine: Secondary | ICD-10-CM | POA: Diagnosis not present

## 2016-01-20 DIAGNOSIS — B3731 Acute candidiasis of vulva and vagina: Secondary | ICD-10-CM

## 2016-01-20 DIAGNOSIS — N760 Acute vaginitis: Secondary | ICD-10-CM | POA: Diagnosis not present

## 2016-01-20 DIAGNOSIS — B9689 Other specified bacterial agents as the cause of diseases classified elsewhere: Secondary | ICD-10-CM

## 2016-01-20 DIAGNOSIS — Z9889 Other specified postprocedural states: Secondary | ICD-10-CM

## 2016-01-20 MED ORDER — NABUMETONE 500 MG PO TABS: 500.0000 mg | ORAL_TABLET | Freq: Two times a day (BID) | ORAL | Status: DC

## 2016-01-20 MED ORDER — CICLOPIROX 8 % EX SOLN: Freq: Every day | CUTANEOUS | Status: DC

## 2016-01-20 MED ORDER — OXYCODONE-ACETAMINOPHEN 10-325 MG PO TABS: 1.0000 | ORAL_TABLET | Freq: Four times a day (QID) | ORAL | Status: DC | PRN

## 2016-01-20 MED ORDER — EFINACONAZOLE 10 % EX SOLN: 1.0000 | Freq: Every morning | CUTANEOUS | Status: DC

## 2016-01-20 NOTE — Patient Instructions (Addendum)
Trichomonas Test The trichomonas test is done to diagnose trichomoniasis, an infection caused by an organism called Trichomonas. Trichomoniasis is a sexually transmitted infection (STI). In women, it causes vaginal infections. In men, it can cause the tube that carries urine (urethra) to become inflamed (urethritis). You may have this test as a part of a routine screening for STIs or if you have symptoms of trichomoniasis. To perform the test, your health care provider will take a sample of discharge. The sample is taken from the vagina or cervix in women and from the urethra in men. A urine sample can also be used for testing. RESULTS It is your responsibility to obtain your test results. Ask the lab or department performing the test when and how you will get your results. Contact your health care provider to discuss any questions you have about your results.  Meaning of Negative Test Results A negative test means you do not have trichomoniasis. Follow your health care provider's directions about any follow-up testing.  Meaning of Positive Test Results A positive test result means you have an active infection that needs to be treated with antibiotic medicine. All your current sexual partners must also be treated or it is likely you will get reinfected.  If your test is positive, your health care provider will start you on medicine and may advise you to:  Not have sexual intercourse until your infection has cleared up.  Use a latex condom properly every time you have sexual intercourse.  Limit the number of sexual partners you have. The more partners you have, the greater your risk of contracting trichomoniasis or another STI.  Tell all sexual partners about your infection so they can also be treated and to prevent reinfection.   This information is not intended to replace advice given to you by your health care provider. Make sure you discuss any questions you have with your health care  provider.   Document Released: 12/23/2004 Document Revised: 12/11/2014 Document Reviewed: 12/02/2013 Elsevier Interactive Patient Education 2016 Elsevier Inc. Bacterial Vaginosis Bacterial vaginosis is a vaginal infection that occurs when the normal balance of bacteria in the vagina is disrupted. It results from an overgrowth of certain bacteria. This is the most common vaginal infection in women of childbearing age. Treatment is important to prevent complications, especially in pregnant women, as it can cause a premature delivery. CAUSES  Bacterial vaginosis is caused by an increase in harmful bacteria that are normally present in smaller amounts in the vagina. Several different kinds of bacteria can cause bacterial vaginosis. However, the reason that the condition develops is not fully understood. RISK FACTORS Certain activities or behaviors can put you at an increased risk of developing bacterial vaginosis, including:  Having a new sex partner or multiple sex partners.  Douching.  Using an intrauterine device (IUD) for contraception. Women do not get bacterial vaginosis from toilet seats, bedding, swimming pools, or contact with objects around them. SIGNS AND SYMPTOMS  Some women with bacterial vaginosis have no signs or symptoms. Common symptoms include:  Grey vaginal discharge.  A fishlike odor with discharge, especially after sexual intercourse.  Itching or burning of the vagina and vulva.  Burning or pain with urination. DIAGNOSIS  Your health care provider will take a medical history and examine the vagina for signs of bacterial vaginosis. A sample of vaginal fluid may be taken. Your health care provider will look at this sample under a microscope to check for bacteria and abnormal cells. A vaginal pH  test may also be done.  TREATMENT  Bacterial vaginosis may be treated with antibiotic medicines. These may be given in the form of a pill or a vaginal cream. A second round of  antibiotics may be prescribed if the condition comes back after treatment. Because bacterial vaginosis increases your risk for sexually transmitted diseases, getting treated can help reduce your risk for chlamydia, gonorrhea, HIV, and herpes. HOME CARE INSTRUCTIONS   Only take over-the-counter or prescription medicines as directed by your health care provider.  If antibiotic medicine was prescribed, take it as directed. Make sure you finish it even if you start to feel better.  Tell all sexual partners that you have a vaginal infection. They should see their health care provider and be treated if they have problems, such as a mild rash or itching.  During treatment, it is important that you follow these instructions:  Avoid sexual activity or use condoms correctly.  Do not douche.  Avoid alcohol as directed by your health care provider.  Avoid breastfeeding as directed by your health care provider. SEEK MEDICAL CARE IF:   Your symptoms are not improving after 3 days of treatment.  You have increased discharge or pain.  You have a fever. MAKE SURE YOU:   Understand these instructions.  Will watch your condition.  Will get help right away if you are not doing well or get worse. FOR MORE INFORMATION  Centers for Disease Control and Prevention, Division of STD Prevention: [employer]raiser[employer]raud[employer] American Sexual Health Association (ASHA): www.ashastd.org    This information is not intended to replace advice given to you by your health care provider. Make sure you discuss any questions you have with your health care provider.   Document Released: 11/20/2005 Document Revised: 12/11/2014 Document Reviewed: 07/02/2013 Elsevier Interactive Patient Education [employer].

## 2016-01-20 NOTE — Progress Notes (Signed)
Subjective: 9 weeks post op Rt Cotton 11/19/15. Patient experiencing some pain at work working 6 hours on her feet.   Objective: Handling well with CAM walker. No edema or erythema noted.  Assessment: Satisfactory progress.  Plan: Continue limit ambulation 6 hours per day at work. Start wearing tennis shoes with orthotics as tolerated. Prescribed Relafen, Oxycodone, and Jublia. Return in 3 weeks.

## 2016-01-20 NOTE — Patient Instructions (Signed)
9 weeks post op.  Had done well with CAM walker. May try regular tennis shoe as tolerated. May take medication as needed. Return in 3 weeks.

## 2016-01-20 NOTE — Progress Notes (Signed)
35 y.o. Single African American female [phone] here to establish gyn care and here for problem.( Patient is former patient of mine). Patient has complaint of chronic vaginal symptoms of  and increase discharge. Describes discharge as thick white at times and sometimes watery white discharge with odor off and on..Onset of symptoms 3-4 days ago. Denies new personal products or vaginal dryness. No  STD concerns. Urinary symptoms just odor, no frequency or urgency. . Contraception is Nexplanon.( her third one) working well. Aex due 7/17. Plans to have here. No other health issues today.   O:Healthy female WDWN Affect: normal, orientation x 3  Exam:Skin: warm and dry Left arm: Nexplanon palpated intact. CVAT;negative bilateral Abdomen:soft, non tender Inguinal Lymph node: no enlargement or tenderness Pelvic exam: External genital: normal female, slight increase pink on vulva, no exudate, no lesions BUS: negative Bladder, urethral meatus non tender Vagina: copious yellow white thick discharge noted. Ph:4.5   ,Wet prep taken, Affirm taken Cervix: normal, non tender, no CMT Uterus: normal, non tender Adnexa:normal, non tender, no masses or fullness noted  Urine: POCT WBC 2+  Wet Prep results:positive for yeast and clue cells,TNTC, ? trichomonad   A:Normal pelvic exam No indication of UTI BV chronic history ? overtreatment Yeast vaginitis R/O Trichomonas  P:Discussed findings of yeast, BV and ? Trichomonas and etiology. Discussed Aveeno or baking soda sitz bath for comfort.  Discussed will await affirm results before treating, due to chronic occurrence and possible over treatment. Discussed TOC once treatment completed and to discuss management of chronic problem. Patient agreeable. Encouraged patient to increase water intake to help with urine concentration and  Cranberry juice use. Reviewed UTI warning signs and need to advise.  Rv TOC  Rv prn

## 2016-01-21 ENCOUNTER — Other Ambulatory Visit: Payer: Self-pay | Admitting: Certified Nurse Midwife

## 2016-01-21 DIAGNOSIS — N760 Acute vaginitis: Principal | ICD-10-CM

## 2016-01-21 DIAGNOSIS — B9689 Other specified bacterial agents as the cause of diseases classified elsewhere: Secondary | ICD-10-CM

## 2016-01-21 LAB — WET PREP BY MOLECULAR PROBE
CANDIDA SPECIES: POSITIVE — AB
Gardnerella vaginalis: POSITIVE — AB
TRICHOMONAS VAG: NEGATIVE

## 2016-01-21 MED ORDER — CLINDAMYCIN PHOSPHATE 2 % VA CREA
TOPICAL_CREAM | VAGINAL | Status: DC
Start: 2016-01-21 — End: 2016-02-03

## 2016-01-21 MED ORDER — FLUCONAZOLE 150 MG PO TABS: ORAL_TABLET | ORAL | Status: DC

## 2016-01-28 NOTE — Progress Notes (Signed)
Encounter reviewed Avyan Livesay, MD   

## 2016-02-03 ENCOUNTER — Encounter: Payer: Self-pay | Admitting: Certified Nurse Midwife

## 2016-02-03 ENCOUNTER — Ambulatory Visit (INDEPENDENT_AMBULATORY_CARE_PROVIDER_SITE_OTHER): Payer: Commercial Managed Care - HMO | Admitting: Certified Nurse Midwife

## 2016-02-03 VITALS — BP 132/82 | HR 70 | Resp 16 | Ht 68.0 in | Wt 182.0 lb

## 2016-02-03 DIAGNOSIS — N760 Acute vaginitis: Secondary | ICD-10-CM | POA: Diagnosis not present

## 2016-02-03 DIAGNOSIS — B373 Candidiasis of vulva and vagina: Secondary | ICD-10-CM

## 2016-02-03 DIAGNOSIS — A499 Bacterial infection, unspecified: Secondary | ICD-10-CM | POA: Diagnosis not present

## 2016-02-03 DIAGNOSIS — B3731 Acute candidiasis of vulva and vagina: Secondary | ICD-10-CM

## 2016-02-03 DIAGNOSIS — B9689 Other specified bacterial agents as the cause of diseases classified elsewhere: Secondary | ICD-10-CM

## 2016-02-03 NOTE — Progress Notes (Signed)
35 y.o. Single African American female [phone] here for follow up of BV and Yeast vaginitis treated with Cleocin cream and Diflucan initiated on 01/21/16. Completed all medication as directed. Not sexually active during treatment or now. Still having some discharge, no odor or burning now. Slight? Itching. No new personal products. No UTI symptoms. Has been using baking soda sitz bath which has helped with discharge. No other concerns today.  O: Healthy WD,WN female           Contraception  Nexplanon Affect: normal, orientation x 3 Skin: warm and dry Abdomen:soft, non tender Pelvic exam:EXTERNAL GENITALIA: normal appearing vulva with no masses, tenderness or lesions BUS negative VAGINA: small amount of white green discharge, no odor or tenderness, affirm taken CERVIX: no lesions or cervical motion tenderness and normal appearance UTERUS: normal,non tender ADNEXA: no masses palpable and nontender  A: Chronic history of BV and ? Yeast here for TOC Normal pelvic exam   P: Discussed findings of normal pelvic exam and some discharge noted. Continue sitz bath prn comfort. Await affirm results and proceed as indicated. Patient feels much better and is hopeful that BV is resolved. Stressed condom use.   RV  prn

## 2016-02-03 NOTE — Patient Instructions (Signed)

## 2016-02-04 ENCOUNTER — Other Ambulatory Visit: Payer: Self-pay | Admitting: Certified Nurse Midwife

## 2016-02-04 DIAGNOSIS — B9689 Other specified bacterial agents as the cause of diseases classified elsewhere: Secondary | ICD-10-CM

## 2016-02-04 DIAGNOSIS — B373 Candidiasis of vulva and vagina: Secondary | ICD-10-CM

## 2016-02-04 DIAGNOSIS — B3731 Acute candidiasis of vulva and vagina: Secondary | ICD-10-CM

## 2016-02-04 DIAGNOSIS — N760 Acute vaginitis: Principal | ICD-10-CM

## 2016-02-04 LAB — WET PREP BY MOLECULAR PROBE
CANDIDA SPECIES: POSITIVE — AB
Gardnerella vaginalis: POSITIVE — AB
Trichomonas vaginosis: NEGATIVE

## 2016-02-04 MED ORDER — HYLAFEM VA SUPP
1.0000 | Freq: Every day | VAGINAL | Status: DC
Start: 2016-02-04 — End: 2016-02-16

## 2016-02-09 ENCOUNTER — Emergency Department (HOSPITAL_BASED_OUTPATIENT_CLINIC_OR_DEPARTMENT_OTHER)
Admission: EM | Admit: 2016-02-09 | Discharge: 2016-02-09 | Disposition: A | Discharge status: Home / Self Care | Payer: Commercial Managed Care - HMO | Attending: Emergency Medicine | Admitting: Emergency Medicine

## 2016-02-09 ENCOUNTER — Encounter (HOSPITAL_BASED_OUTPATIENT_CLINIC_OR_DEPARTMENT_OTHER): Payer: Self-pay | Admitting: Emergency Medicine

## 2016-02-09 DIAGNOSIS — Z3202 Encounter for pregnancy test, result negative: Secondary | ICD-10-CM | POA: Diagnosis not present

## 2016-02-09 DIAGNOSIS — R509 Fever, unspecified: Secondary | ICD-10-CM | POA: Diagnosis present

## 2016-02-09 DIAGNOSIS — B349 Viral infection, unspecified: Secondary | ICD-10-CM | POA: Insufficient documentation

## 2016-02-09 DIAGNOSIS — Z87891 Personal history of nicotine dependence: Secondary | ICD-10-CM | POA: Insufficient documentation

## 2016-02-09 DIAGNOSIS — Z79899 Other long term (current) drug therapy: Secondary | ICD-10-CM | POA: Diagnosis not present

## 2016-02-09 DIAGNOSIS — Z880 Allergy status to penicillin: Secondary | ICD-10-CM | POA: Insufficient documentation

## 2016-02-09 DIAGNOSIS — G43909 Migraine, unspecified, not intractable, without status migrainosus: Secondary | ICD-10-CM | POA: Diagnosis not present

## 2016-02-09 DIAGNOSIS — F419 Anxiety disorder, unspecified: Secondary | ICD-10-CM | POA: Diagnosis not present

## 2016-02-09 LAB — PREGNANCY, URINE: Preg Test, Ur: NEGATIVE

## 2016-02-09 MED ORDER — KETOROLAC TROMETHAMINE 60 MG/2ML IM SOLN
60.0000 mg | Freq: Once | INTRAMUSCULAR | Status: AC
  Administered 2016-02-09: 60 mg via INTRAMUSCULAR

## 2016-02-09 MED ORDER — ACETAMINOPHEN 500 MG PO TABS
1000.0000 mg | ORAL_TABLET | Freq: Once | ORAL | Status: AC
Start: 2016-02-09 — End: 2016-02-09
  Administered 2016-02-09: 1000 mg via ORAL

## 2016-02-09 MED ORDER — FLUTICASONE PROPIONATE 50 MCG/ACT NA SUSP: 2.0000 | Freq: Every day | NASAL | Status: DC

## 2016-02-09 MED ORDER — IBUPROFEN 800 MG PO TABS: 800.0000 mg | ORAL_TABLET | Freq: Three times a day (TID) | ORAL | Status: DC

## 2016-02-09 MED ORDER — GUAIFENESIN 100 MG/5ML PO SOLN
5.0000 mL | Freq: Once | ORAL | Status: AC
  Administered 2016-02-09: 100 mg via ORAL

## 2016-02-09 MED ORDER — LORATADINE 10 MG PO TABS
10.0000 mg | ORAL_TABLET | Freq: Once | ORAL | Status: AC
  Administered 2016-02-09: 10 mg via ORAL

## 2016-02-09 MED ORDER — BENZONATATE 100 MG PO CAPS: 100.0000 mg | ORAL_CAPSULE | Freq: Three times a day (TID) | ORAL | Status: DC

## 2016-02-09 MED ORDER — CETIRIZINE-PSEUDOEPHEDRINE ER 5-120 MG PO TB12: 1.0000 | ORAL_TABLET | Freq: Every day | ORAL | Status: DC

## 2016-02-09 NOTE — ED Notes (Signed)
Body aches, fever, chills, HA, Runny nose fatigue, weakness, x3 days.

## 2016-02-09 NOTE — Discharge Instructions (Signed)
Viral Infections °A viral infection can be caused by different types of viruses. Most viral infections are not serious and resolve on their own. However, some infections may cause severe symptoms and may lead to further complications. °SYMPTOMS °Viruses can frequently cause: °· Minor sore throat. °· Aches and pains. °· Headaches. °· Runny nose. °· Different types of rashes. °· Watery eyes. °· Tiredness. °· Cough. °· Loss of appetite. °· Gastrointestinal infections, resulting in nausea, vomiting, and diarrhea. °These symptoms do not respond to antibiotics because the infection is not caused by bacteria. However, you might catch a bacterial infection following the viral infection. This is sometimes called a "superinfection." Symptoms of such a bacterial infection may include: °· Worsening sore throat with pus and difficulty swallowing. °· Swollen neck glands. °· Chills and a high or persistent fever. °· Severe headache. °· Tenderness over the sinuses. °· Persistent overall ill feeling (malaise), muscle aches, and tiredness (fatigue). °· Persistent cough. °· Yellow, green, or brown mucus production with coughing. °HOME CARE INSTRUCTIONS  °· Only take over-the-counter or prescription medicines for pain, discomfort, diarrhea, or fever as directed by your caregiver. °· Drink enough water and fluids to keep your urine clear or pale yellow. Sports drinks can provide valuable electrolytes, sugars, and hydration. °· Get plenty of rest and maintain proper nutrition. Soups and broths with crackers or rice are fine. °SEEK IMMEDIATE MEDICAL CARE IF:  °· You have severe headaches, shortness of breath, chest pain, neck pain, or an unusual rash. °· You have uncontrolled vomiting, diarrhea, or you are unable to keep down fluids. °· You or your child has an oral temperature above 102° F (38.9° C), not controlled by medicine. °· Your baby is older than 3 months with a rectal temperature of 102° F (38.9° C) or higher. °· Your baby is 3  months old or younger with a rectal temperature of 100.4° F (38° C) or higher. °MAKE SURE YOU:  °· Understand these instructions. °· Will watch your condition. °· Will get help right away if you are not doing well or get worse. °  °This information is not intended to replace advice given to you by your health care provider. Make sure you discuss any questions you have with your health care provider. °  °Document Released: 08/30/2005 Document Revised: 02/12/2012 Document Reviewed: 04/28/2015 °Elsevier Interactive Patient Education ©2016 Elsevier Inc. ° °

## 2016-02-09 NOTE — Progress Notes (Signed)
Encounter reviewed Noelene Gang, MD   

## 2016-02-09 NOTE — ED Notes (Signed)
Pt given d/c instructions as per chart. Verbalizes understanding. No questions. Rx x 4 

## 2016-02-09 NOTE — ED Provider Notes (Signed)
CSN: [id number]     Arrival date & time 02/09/16  [id number] History   First MD Initiated Contact with Patient 02/09/16 0250     No chief complaint on file.    (Consider location/radiation/quality/duration/timing/severity/associated sxs/prior Treatment) Patient is a 35 y.o. female presenting with URI. The history is provided by the patient.  URI Presenting symptoms: congestion, fever and rhinorrhea   Presenting symptoms: no cough, no facial pain and no sore throat   Congestion:    Location:  Nasal   Interferes with sleep: no     Interferes with eating/drinking: no   Fever:    Timing:  Constant   Progression:  Unchanged Severity:  Moderate Onset quality:  Gradual Timing:  Constant Progression:  Unchanged Chronicity:  New Relieved by:  Nothing Worsened by:  Nothing tried Ineffective treatments: beandryl. Associated symptoms: myalgias   Associated symptoms: no headaches, no neck pain and no swollen glands   Risk factors: sick contacts   Risk factors: no immunosuppression   Risk factors comment:  Son has same   Past Medical History  Diagnosis Date  . Migraine   . Anxiety    Past Surgical History  Procedure Laterality Date  . Breast lumpectomy    . Tonsillectomy and adenoidectomy    . Bunionectomy    . Breast cyst    . Cotton osteotomy w/ bone graft Right 11/19/2015    RT FOOT  . Nexplanon insertion      inserted 09-06-15   Family History  Problem Relation Age of Onset  . Breast cancer Mother   . Lung cancer Paternal Aunt   . Diabetes Maternal Grandmother   . Hypertension Maternal Grandfather   . Heart disease Maternal Grandfather     chf  . Esophageal cancer Paternal Grandmother   . Hypertension Paternal Grandfather   . Heart attack Paternal Grandfather   . Diabetes Paternal Aunt    Social History  Substance Use Topics  . Smoking status: Former Research scientist (life sciences)  . Smokeless tobacco: Never Used  . Alcohol Use: No   OB History    Gravida Para Term Preterm AB TAB SAB  Ectopic Multiple Living   5 2 2  3 2 1   2      Review of Systems  Constitutional: Positive for fever.  HENT: Positive for congestion and rhinorrhea. Negative for sore throat.   Respiratory: Negative for cough.   Gastrointestinal: Negative for vomiting and diarrhea.  Musculoskeletal: Positive for myalgias. Negative for neck pain and neck stiffness.  Neurological: Negative for headaches.  All other systems reviewed and are negative.     Allergies  Pineapple; Penicillins; Sulfa antibiotics; and Sumatriptan  Home Medications   Prior to Admission medications   Medication Sig Start Date End Date Taking? Authorizing Provider  ALPRAZolam Duanne Moron) 0.5 MG tablet Take ONE (1) tablet by mouth twice daily 12/21/15   Historical Provider, MD  Efinaconazole (JUBLIA) 10 % SOLN Apply 1 applicator topically every morning. Patient not taking: Reported on 01/20/2016 01/20/16   Myeong O Sheard, DPM  etonogestrel (NEXPLANON) 68 MG IMPL implant 1 each by Subdermal route once.    Historical Provider, MD  Homeopathic Products (HYLAFEM) SUPP Place 1 suppository vaginally at bedtime. 02/04/16   Regina Eck, CNM  ketorolac (TORADOL) 60 MG/2ML SOLN injection Inject 60 mg into the vein as needed for pain.    Historical Provider, MD  nabumetone (RELAFEN) 500 MG tablet Take 1 tablet (500 mg total) by mouth 2 (two) times daily. 01/20/16  Myeong O Sheard, DPM  oxyCODONE-acetaminophen (PERCOCET) 10-325 MG tablet Take 1 tablet by mouth every 6 (six) hours as needed for pain. 01/20/16   Myeong O Sheard, DPM  topiramate (TOPAMAX) 100 MG tablet Take 100 mg by mouth 2 (two) times daily.    Historical Provider, MD   BP 187/102 mmHg  Pulse 115  Temp(Src) 103.2 F (39.6 C) (Oral)  Resp 18  Ht 5\' 8"  (1.727 m)  Wt 182 lb (82.555 kg)  BMI 27.68 kg/m2  SpO2 100%  LMP 09/04/2015 Physical Exam  Constitutional: She is oriented to person, place, and time. She appears well-developed and well-nourished. No distress.  HENT:   Head: Normocephalic and atraumatic.  Mouth/Throat: Oropharynx is clear and moist.  Eyes: EOM are normal. Pupils are equal, round, and reactive to light.  Neck: Normal range of motion. Neck supple.  Cardiovascular: Normal rate, regular rhythm and intact distal pulses.   Pulmonary/Chest: Effort normal and breath sounds normal. No respiratory distress. She has no wheezes. She has no rales.  Abdominal: Soft. Bowel sounds are normal. There is no tenderness. There is no rebound and no guarding.  Musculoskeletal: Normal range of motion.  Lymphadenopathy:    She has no cervical adenopathy.  Neurological: She is alert and oriented to person, place, and time.  Skin: Skin is warm and dry.  Psychiatric: She has a normal mood and affect.    ED Course  Procedures (including critical care time) Labs Review Labs Reviewed  PREGNANCY, URINE    Imaging Review No results found. I have personally reviewed and evaluated these images and lab results as part of my medical decision-making.   EKG Interpretation None      MDM   Final diagnoses:  None    Lungs clear, exam is benign and reassuring.  Patient's symptoms consistent with a viral illness.  Boyfriend is also a patient and her child has the same.  Will treat symptomatically.  Drink copious fluids.  Alternate tylenol and ibuprofen for fever and aches.  Take medications as prescribed.  Strict return precautions given.  Note given for work    Barry Faircloth, MD 02/09/16 [phone]

## 2016-02-10 ENCOUNTER — Encounter: Payer: Commercial Managed Care - HMO | Admitting: Podiatry

## 2016-02-16 ENCOUNTER — Encounter: Payer: Self-pay | Admitting: Podiatry

## 2016-02-16 ENCOUNTER — Telehealth: Payer: Self-pay | Admitting: *Deleted

## 2016-02-16 ENCOUNTER — Ambulatory Visit (INDEPENDENT_AMBULATORY_CARE_PROVIDER_SITE_OTHER): Payer: Commercial Managed Care - HMO | Admitting: Podiatry

## 2016-02-16 DIAGNOSIS — Z9889 Other specified postprocedural states: Secondary | ICD-10-CM

## 2016-02-16 MED ORDER — METRONIDAZOLE 0.75 % VA GEL: 1.0000 | Freq: Every day | VAGINAL | Status: DC

## 2016-02-16 MED ORDER — OXYCODONE-ACETAMINOPHEN 10-325 MG PO TABS: 1.0000 | ORAL_TABLET | Freq: Four times a day (QID) | ORAL | Status: DC | PRN

## 2016-02-16 MED ORDER — FLUCONAZOLE 150 MG PO TABS: 150.0000 mg | ORAL_TABLET | Freq: Once | ORAL | Status: DC

## 2016-02-16 NOTE — Telephone Encounter (Signed)
Routing to [employer] CNM for review and advise. May patient have Flagyl 500 mg bid x 7 days or Metrogel qhs x 5 days?

## 2016-02-16 NOTE — Progress Notes (Signed)
Subjective: 35 year old female presents for 13 week post op (01/19/15) check, which is progressing well without complication.  Objective: No pain during range of motion. The first ray is in good plantar flexion. STJ pronatory trend still present with full weight bearing.   Assessment: Satisfactory recovery from Cotton osteotomy with graft. Improved first ray plantar flexion right.   Plan: Try to stay in Tennis shoes with orthotics as much as possible during ambulation. Discussed left foot surgery, same as the right. Cotton osteotomy on left with bone graft consent form reviewed.

## 2016-02-16 NOTE — Telephone Encounter (Signed)
Attempted to reach patient at number provided [phone]. There was no answer and no voicemail to leave a message.  Rx for Metrogel qhs x 5 days and Diflucan 150 mg x2 sent to pharmacy on file. Patient will need a follow up appointment with Melvia Heaps CNM to start on Boric Acid capsules.

## 2016-02-16 NOTE — Patient Instructions (Signed)
13 weeks post op right foot progressing well. Try to stay in Tennis shoes with orthotics as much as possible during ambulation. Discussed left foot surgery, same as the right. Cotton osteotomy on left with bone graft consent form reviewed.

## 2016-02-16 NOTE — Telephone Encounter (Signed)
Patient says the hylafem that Ms. Debbie sent in is too expensive wondering if there is a alternative. Best # to reach: [phone] Preferred Pharmacy: [place]

## 2016-02-16 NOTE — Telephone Encounter (Signed)
Patient has BV and yeast. She can have Metrogel every hs x 5 and Diflucan 150 mg  X 2  She needs TOC here and I am going to start her with Boric acid capsules for chronic BV

## 2016-02-29 NOTE — Telephone Encounter (Signed)
Left message to call Meleane Selinger at [phone]. 

## 2016-03-01 NOTE — Telephone Encounter (Signed)
Patient returning call.

## 2016-03-01 NOTE — Telephone Encounter (Signed)
Spoke with patient. Patient states that she picked up Metrogel and Diflucan prescriptions today. Will begin taking tonight. Follow up appointment scheduled with Melvia Heaps CNM for 03/15/2016 at 10 am. She is agreeable to date and time.  Routing to provider for final review. Patient agreeable to disposition. Will close encounter.

## 2016-03-08 ENCOUNTER — Telehealth: Payer: Self-pay | Admitting: *Deleted

## 2016-03-08 ENCOUNTER — Other Ambulatory Visit: Payer: Self-pay | Admitting: Podiatry

## 2016-03-08 MED ORDER — OXYCODONE-ACETAMINOPHEN 10-325 MG PO TABS: 1.0000 | ORAL_TABLET | Freq: Four times a day (QID) | ORAL | Status: DC | PRN

## 2016-03-08 NOTE — Telephone Encounter (Signed)
03/08/2016 PATIENT CALLED THIS AM AND SAYS SHE IS OUT OF PAIN MEDICINE, CAN YOU REFILL?

## 2016-03-15 ENCOUNTER — Ambulatory Visit (INDEPENDENT_AMBULATORY_CARE_PROVIDER_SITE_OTHER): Payer: Commercial Managed Care - HMO | Admitting: Certified Nurse Midwife

## 2016-03-15 ENCOUNTER — Ambulatory Visit: Payer: Commercial Managed Care - HMO | Admitting: Certified Nurse Midwife

## 2016-03-15 ENCOUNTER — Encounter: Payer: Self-pay | Admitting: Certified Nurse Midwife

## 2016-03-15 VITALS — BP 118/76 | HR 72 | Resp 16 | Ht 68.0 in | Wt 186.0 lb

## 2016-03-15 DIAGNOSIS — N760 Acute vaginitis: Secondary | ICD-10-CM

## 2016-03-15 DIAGNOSIS — A499 Bacterial infection, unspecified: Secondary | ICD-10-CM

## 2016-03-15 DIAGNOSIS — A6000 Herpesviral infection of urogenital system, unspecified: Secondary | ICD-10-CM

## 2016-03-15 DIAGNOSIS — B9689 Other specified bacterial agents as the cause of diseases classified elsewhere: Secondary | ICD-10-CM

## 2016-03-15 DIAGNOSIS — B3731 Acute candidiasis of vulva and vagina: Secondary | ICD-10-CM

## 2016-03-15 DIAGNOSIS — B373 Candidiasis of vulva and vagina: Secondary | ICD-10-CM

## 2016-03-15 MED ORDER — VALACYCLOVIR HCL 500 MG PO TABS: ORAL_TABLET | ORAL | Status: DC

## 2016-03-15 NOTE — Patient Instructions (Signed)

## 2016-03-15 NOTE — Progress Notes (Signed)
35 y.o. Married [place] American female [phone] here for follow up of BV/yeast treated with Metrogel initiated on 1 1/2 week ago. Completed all medication as directed.  Patient denies any odor to discharge now or vaginal issues. Patient is complaining of vulva itching. Changed soap to new bath wash and feels this may the problem. No sexual activity since treatment. No partner change or STD concerns. Patient recently saw her PCP at [facility] for aex and labs, all normal per patient. No other health concerns today.   O: Healthy WD,WN female Affect: normal orientation x 3 Skin:warm and dry Abdomen:soft non tender Inguinal lymph nodes slightly enlarged, non tender  Pelvic exam:EXTERNAL GENITALIA: normal appearing vulva with superficial laceration appearance at perineal area under vaginal opening and herpetic blisters noted on right labia, slightly  tenderness , Herpes culture taken.  Mons area ingrown hair noted, no pustule or redness, non tender VAGINA: white non odorous discharge noted, non tender, no blisters noted at fourchette area, affirm taken CERVIX: no lesions or cervical motion tenderness and normal appearance UTERUS: normal,non tender, no masses ADNEXA: no masses palpable and nontender RECTUM: normal appearance, no HSV blisters noted  A: Normal pelvic exam BV and yeast vaginitis ? Resolved,chronic history Herpes blisters noted, patient denies any knowledge of ever having outbreak in vaginal area or oral area Ingrown hair in mons area   P: Discussed findings of normal pelvic exam and will await affirm to confirm if BV and yeast are resolved. Discussed and areas shown to patient in the mirror of Herpes blisters and superficial lacerations consist with HSV outbreak. Patient declines any knowledge of exposure or having HSV. Discussed etiology and need to treat to decrease pain and spread to other areas. Discussed no sexual activity during outbreak. Discussed sexually transmitted and  can be transmitted orally also. Rx Valtrex with instructions. Epsom salt or Aveeno bath for comfort and also will help with ingrown hair area noted on Mons. Warning signs of infection to patient and need to advise if occurs. Questions addressed at length regarding HSV and ingrown hair. Patient also denies any diabetes history or concerns with PCP. Patient to sign request for labs done at PCP to check for glucose status. Reviewed records recently scanned in chart from other provider and HSV 1,2 noted positive serology. Patient declines awareness of this.  Labs Herpes culture, declines HSV 1,2 serology and Hgb A1-c( due to chronic vaginitis)   RV  2 weeks for recheck

## 2016-03-16 ENCOUNTER — Other Ambulatory Visit: Payer: Self-pay | Admitting: Certified Nurse Midwife

## 2016-03-16 ENCOUNTER — Telehealth: Payer: Self-pay

## 2016-03-16 DIAGNOSIS — B3731 Acute candidiasis of vulva and vagina: Secondary | ICD-10-CM

## 2016-03-16 DIAGNOSIS — B373 Candidiasis of vulva and vagina: Secondary | ICD-10-CM

## 2016-03-16 LAB — WET PREP BY MOLECULAR PROBE
CANDIDA SPECIES: POSITIVE — AB
Gardnerella vaginalis: NEGATIVE
TRICHOMONAS VAG: NEGATIVE

## 2016-03-16 MED ORDER — FLUCONAZOLE 150 MG PO TABS: 150.0000 mg | ORAL_TABLET | Freq: Once | ORAL | Status: DC

## 2016-03-16 NOTE — Telephone Encounter (Signed)
-----   Message from Regina Eck, CNM sent at 03/16/2016  7:55 AM EDT ----- Notify patient that her affirm for BV is negative, so it is finally clear. She is still showing yeast, when did she take the Diflucan? She needs to start on Florajen oral probiotic daily to boost her immunity with all the medication use. Please give instructions on how to purchase this. 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  No sexual activity until seen here. She needs to schedule for 2-3 weeks out to recheck vulva area and discussion of HSV management. Please schedule.

## 2016-03-16 NOTE — Telephone Encounter (Signed)
lmtcb

## 2016-03-16 NOTE — Telephone Encounter (Signed)
Patient notified of results. See lab 

## 2016-03-17 LAB — HERPES SIMPLEX VIRUS CULTURE: Organism ID, Bacteria: NOT DETECTED

## 2016-03-21 ENCOUNTER — Telehealth: Payer: Self-pay

## 2016-03-21 NOTE — Telephone Encounter (Signed)
Patient notified of results. See lab 

## 2016-03-21 NOTE — Progress Notes (Signed)
Encounter reviewed Jill Jertson, MD   

## 2016-03-21 NOTE — Telephone Encounter (Signed)
-----   Message from Regina Eck, CNM sent at 03/21/2016  7:42 AM EDT ----- Notify patient that HSV culture was negative, but the appearance was HSV and she had positive serology in past. Also I have seen BV more prevalent when HSV is present. Patient needs to keep OV follow up to  [facility] and discuss management

## 2016-03-21 NOTE — Telephone Encounter (Signed)
lmtcb

## 2016-03-30 ENCOUNTER — Encounter: Payer: Self-pay | Admitting: Certified Nurse Midwife

## 2016-03-30 ENCOUNTER — Ambulatory Visit (INDEPENDENT_AMBULATORY_CARE_PROVIDER_SITE_OTHER): Payer: Commercial Managed Care - HMO | Admitting: Certified Nurse Midwife

## 2016-03-30 VITALS — BP 110/80 | HR 70 | Resp 16 | Ht 68.0 in | Wt 187.0 lb

## 2016-03-30 DIAGNOSIS — A6000 Herpesviral infection of urogenital system, unspecified: Secondary | ICD-10-CM

## 2016-03-30 DIAGNOSIS — B3731 Acute candidiasis of vulva and vagina: Secondary | ICD-10-CM

## 2016-03-30 DIAGNOSIS — N760 Acute vaginitis: Secondary | ICD-10-CM | POA: Diagnosis not present

## 2016-03-30 DIAGNOSIS — A499 Bacterial infection, unspecified: Secondary | ICD-10-CM

## 2016-03-30 DIAGNOSIS — B9689 Other specified bacterial agents as the cause of diseases classified elsewhere: Secondary | ICD-10-CM

## 2016-03-30 DIAGNOSIS — B373 Candidiasis of vulva and vagina: Secondary | ICD-10-CM | POA: Diagnosis not present

## 2016-03-30 MED ORDER — VALACYCLOVIR HCL 500 MG PO TABS: ORAL_TABLET | ORAL | Status: DC

## 2016-03-30 NOTE — Progress Notes (Signed)
35 y.o. Single African American female [phone] here for follow up of chronic history of yeast vaginitis treated with Diflucan  initiated on 03/16/16 and for HSV outbreak noted at last visit treated with Valtrex. Completed all medication as directed.  Denies any symptoms of vaginal itching or burning or increase in vaginal discharge. Started period 03/29/16. Continues with Valtrex daily as directed. Patient feels herpes outbreak has cleared up, no further blisters noted. Partner has had not symptoms. Contraception Nexplanon.   O: Healthy WD,WN female Affect:  Normal, orientation x 3 Skin:warm and dry Abdomen:soft, non tender Inguinal lymph nodes not enlarged or tender now  Pelvic exam:EXTERNAL GENITALIA: normal appearing vulva with no masses, tenderness or lesions, no herpes blisters noted, very small amount of cracking still noted on perineum below fourchette, non tender, no exudate, very improved, from last appointment. Inflamed hair follicle noted at last appointment and has resolved. VAGINA: no abnormal discharge or lesions and scant blood noted, affirm taken CERVIX: no lesions or cervical motion tenderness and scant blood from cervix UTERUS: normal,non tender,anteverted ADNEXA: no masses palpable and nontender RECTUM: no blisters noted around rectum now  A:History of chronic BV and yeast vaginitis Herpes outbreak, patient not aware she had positive titer with previous provider, requesting lab to confirm Yeast vaginitis appears to be resolved TOC for yeast/BV    P: Discussed findings of much improved vulva and vaginal appearance. Discussed etiology of yeast and frequency of BV with HSV outbreak. Discussed HSV suppression with Valtrex and would recommend she stay on daily for at least 6 months if possible. Risks and benefits discussed. Patient will continue with Valtrex as directed. Discussed sexual transmission and need to be aware of this and so does partner. Questions addressed. Rx Valtrex  500 mg daily see order with instructions Will treat per affirm if needed. Will need CMP with aex, due to long term use of Flagyl.  Rv prn, aex   Labs   Instructions given regarding:  RV

## 2016-03-30 NOTE — Patient Instructions (Signed)

## 2016-03-31 ENCOUNTER — Telehealth: Payer: Self-pay

## 2016-03-31 LAB — WET PREP BY MOLECULAR PROBE
CANDIDA SPECIES: NEGATIVE
GARDNERELLA VAGINALIS: NEGATIVE
TRICHOMONAS VAG: NEGATIVE

## 2016-03-31 LAB — HSV(HERPES SIMPLEX VRS) I + II AB-IGG
HSV 1 Glycoprotein G Ab, IgG: 49.7 Index — ABNORMAL HIGH (ref <0.90)
HSV 2 Glycoprotein G Ab, IgG: <0.9 Index (ref <0.90)

## 2016-03-31 NOTE — Telephone Encounter (Signed)
-----   Message from Regina Eck, CNM sent at 03/31/2016  7:38 AM EDT ----- Notify affirm negative for yeast, BV and trichomonas. HSV1,2 pending

## 2016-03-31 NOTE — Progress Notes (Signed)
Encounter reviewed Jill Jertson, MD   

## 2016-03-31 NOTE — Telephone Encounter (Signed)
lmtcb

## 2016-03-31 NOTE — Telephone Encounter (Signed)
Patient notified see result note 

## 2016-04-04 ENCOUNTER — Telehealth: Payer: Self-pay | Admitting: *Deleted

## 2016-04-04 ENCOUNTER — Ambulatory Visit (INDEPENDENT_AMBULATORY_CARE_PROVIDER_SITE_OTHER): Payer: Commercial Managed Care - HMO | Admitting: Podiatry

## 2016-04-04 ENCOUNTER — Encounter: Payer: Self-pay | Admitting: Podiatry

## 2016-04-04 ENCOUNTER — Telehealth: Payer: Self-pay

## 2016-04-04 VITALS — BP 150/98 | HR 78

## 2016-04-04 DIAGNOSIS — Z9889 Other specified postprocedural states: Secondary | ICD-10-CM

## 2016-04-04 MED ORDER — OXYCODONE-ACETAMINOPHEN 10-325 MG PO TABS: 1.0000 | ORAL_TABLET | Freq: Four times a day (QID) | ORAL | Status: DC | PRN

## 2016-04-04 NOTE — Telephone Encounter (Signed)
lmtcb

## 2016-04-04 NOTE — Telephone Encounter (Signed)
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 HSV 2 is negative

## 2016-04-04 NOTE — Telephone Encounter (Signed)
04/04/2016 Patient request a prescription for pain .

## 2016-04-04 NOTE — Progress Notes (Signed)
Subjective: 35 year old female presents for 20 weeks post op (11/19/15) on right foot. Here to receive more pain medication.  Stated that joint is popping at the [facility] right and hurts.   Objective: Occasional pain at the first MCJ off and on through out the day. No pain during range of motion. The first ray is in good plantar flexion. STJ pronatory trend still present with full weight bearing.  Positive of forefoot varus with elevated first ray on left.   Assessment: Satisfactory recovery from Cotton osteotomy with graft. Improved first ray plantar flexion right.  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 Forefoot varus with elevated first ray left with mid foot pain.   Plan: Try to stay in Tennis shoes with orthotics as much as possible during ambulation. Keep Metatarsal binder on both feet as tolerated. May benefit from the same procedure on left.

## 2016-04-04 NOTE — Patient Instructions (Signed)
Bilateral foot pain. Findings reviewed. Metatarsal binder dispensed.

## 2016-04-05 NOTE — Telephone Encounter (Signed)
Patient returning call.

## 2016-04-05 NOTE — Telephone Encounter (Signed)
Patient notified of results. See lab 

## 2016-04-05 NOTE — Telephone Encounter (Signed)
Left message for call back.

## 2016-04-17 ENCOUNTER — Telehealth: Payer: Self-pay | Admitting: Certified Nurse Midwife

## 2016-04-17 NOTE — Telephone Encounter (Signed)
Patient says she is having issues and would like to speak with nurse.

## 2016-04-17 NOTE — Telephone Encounter (Signed)
I feel pt may benefit from discussion of suppressive therapy.  Would recommend OV.

## 2016-04-17 NOTE — Telephone Encounter (Signed)
Spoke with patient. Patient states that she has recurrent bacterial infections. Reports she spoke with Joy regarding symptoms on 04/05/2016 and was advise to return call if symptoms persisted after her cycle ended. Patient states her cycle ended on 04/14/2016. On 04/15/2016 she began to have clear/milky discharge with an odor. Patient is requesting an rx for Clindamycin vaginal cream. "It seems like the Clindamycin works better for me than the Metrogel." Advised I will speak with covering provider as Melvia Heaps CNM is out of the office today and return call with further recommendations. Patient is agreeable.

## 2016-04-18 ENCOUNTER — Ambulatory Visit (INDEPENDENT_AMBULATORY_CARE_PROVIDER_SITE_OTHER): Payer: Commercial Managed Care - HMO | Admitting: Certified Nurse Midwife

## 2016-04-18 ENCOUNTER — Encounter: Payer: Self-pay | Admitting: Certified Nurse Midwife

## 2016-04-18 VITALS — BP 120/70 | HR 70 | Resp 16 | Ht 68.0 in | Wt 183.0 lb

## 2016-04-18 DIAGNOSIS — N761 Subacute and chronic vaginitis: Secondary | ICD-10-CM

## 2016-04-18 DIAGNOSIS — A499 Bacterial infection, unspecified: Secondary | ICD-10-CM | POA: Diagnosis not present

## 2016-04-18 DIAGNOSIS — N760 Acute vaginitis: Secondary | ICD-10-CM | POA: Diagnosis not present

## 2016-04-18 DIAGNOSIS — B9689 Other specified bacterial agents as the cause of diseases classified elsewhere: Secondary | ICD-10-CM

## 2016-04-18 MED ORDER — NONFORMULARY OR COMPOUNDED ITEM: Status: DC

## 2016-04-18 NOTE — Progress Notes (Signed)
Reviewed personally.  M. Suzanne Wilmetta Speiser, MD.  

## 2016-04-18 NOTE — Telephone Encounter (Signed)
Patient returning your call.

## 2016-04-18 NOTE — Progress Notes (Signed)
35 y.o. Single African American female [phone] here with complaint of vaginal symptoms of increase discharge. Describes discharge as white, with odor..Onset of symptoms 3 days ago. Denies new personal products. Using plain Dove soap now.No partner change. 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 Started on oral probiotics and "actually feel better". No pain with sexual activity now. Just concerned it might be the start of BV again. Just finished period and seems to be the time it starts. No STD concerns. Urinary symptoms none . Contraception is Nexplanon.   O:Healthy female WDWN Affect: normal, orientation x 3  Exam: Abdomen:soft, non tender  Inguinal  Lymph nodes: no enlargement or tenderness Pelvic exam: External genital: normal female, no lesions or cracking or exudate or herpetic blisters. BUS: negative Urethral meatus non tender, bladder non tender Vagina: normal appearing non odorous discharge noted. 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 Discussed normal flora of vagina consists of yeast and bacteria and no overgrowth noted of BV.  Discussed change in vaginal ph could due to completion of period and would recommend vaginal Boric acid capsule use for 3 days each month after period. She could start now with Boric acid and advise if problems or reoccurrence. Patient would like to try to lessen medication use, provider agrees. Rx Boric acid see order, Rx given to patient to fill at [facility]. Questions addressed.  Rv prn, aex

## 2016-04-18 NOTE — Telephone Encounter (Signed)
Left message to call Sherryn Pollino at [phone]. 

## 2016-04-18 NOTE — Telephone Encounter (Signed)
Spoke with patient. Advised of message as seen below from [place]. She is agreeable and would like to be seen today. Appointment scheduled for today 04/18/2016 at 1:45 pm with Melvia Heaps CNM.  Cc: Dr.Miller  Routing to provider for final review. Patient agreeable to disposition. Will close encounter.

## 2016-04-19 ENCOUNTER — Telehealth: Payer: Self-pay | Admitting: *Deleted

## 2016-04-19 NOTE — Telephone Encounter (Signed)
04/19/2017 Dr. Caffie Pinto,  Pt request a RX for pain please, She made an appointment for next Tuesday .

## 2016-04-20 ENCOUNTER — Telehealth: Payer: Self-pay | Admitting: Certified Nurse Midwife

## 2016-04-20 DIAGNOSIS — B9689 Other specified bacterial agents as the cause of diseases classified elsewhere: Secondary | ICD-10-CM

## 2016-04-20 DIAGNOSIS — N760 Acute vaginitis: Principal | ICD-10-CM

## 2016-04-20 MED ORDER — NONFORMULARY OR COMPOUNDED ITEM: Status: DC

## 2016-04-20 MED ORDER — OXYCODONE-ACETAMINOPHEN 10-325 MG PO TABS: 1.0000 | ORAL_TABLET | Freq: Four times a day (QID) | ORAL | Status: DC | PRN

## 2016-04-20 NOTE — Telephone Encounter (Signed)
Patient called and requested assistance with her written prescription she got from Melvia Heaps, CNM yesterday for boric acid suppositories. She said, "The prescription does not have enough information on it. It does not have the name of the medication."  [facility]

## 2016-04-20 NOTE — Telephone Encounter (Signed)
New rx written for Boric acid suppositories place 1 suppository in vagina qhs x3 nights after menses #30 [id number]. Rx to Melvia Heaps CNM for review and signature to fax to patient's pharmacy of choice.

## 2016-04-20 NOTE — Addendum Note (Signed)
Addended by: Camelia Phenes on: 04/20/2016 11:47 AM   Modules accepted: Orders

## 2016-04-20 NOTE — Telephone Encounter (Signed)
Spoke with patient. Patient would like rx for Boric acid sent to [facility] in [place], [place]. States this is a [employer]. Advised I will fax rx at this time to [facility]. She is agreeable.  RX for Boric Acid suppositories faxed to [facility] at [phone] with cover sheet and confirmation.  Routing to provider for final review. Patient agreeable to disposition. Will close encounter.

## 2016-04-25 ENCOUNTER — Ambulatory Visit (INDEPENDENT_AMBULATORY_CARE_PROVIDER_SITE_OTHER): Payer: Commercial Managed Care - HMO | Admitting: Podiatry

## 2016-04-25 ENCOUNTER — Encounter: Payer: Self-pay | Admitting: Podiatry

## 2016-04-25 VITALS — BP 135/87 | HR 104

## 2016-04-25 DIAGNOSIS — Z9889 Other specified postprocedural states: Secondary | ICD-10-CM

## 2016-04-25 NOTE — Progress Notes (Signed)
Subjective: 35 year old female presents for 5 months post op (11/19/15) on right foot. She noted occasional shooting pain through the big toe.  Having no problem at work sitting or walking.  Still taking pain medication one or two a day after gets off from work.  Objective: Occasional pain at the first MCJ off and on through out the day. No pain during range of motion. The first ray is in good plantar flexion. STJ pronatory trend still present with full weight bearing.  Positive of forefoot varus with elevated first ray on left.   Assessment: Satisfactory recovery from Cotton osteotomy with graft. Improved first ray plantar flexion right.  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 Forefoot varus with elevated first ray left with mid foot pain.   Plan: Patient will try to have the same procedure on left in June 2017.

## 2016-04-25 NOTE — Patient Instructions (Signed)
5 mo pos top check on right doing good. Need left foot surgery same procedure.

## 2016-05-09 ENCOUNTER — Telehealth: Payer: Self-pay | Admitting: *Deleted

## 2016-05-09 MED ORDER — OXYCODONE-ACETAMINOPHEN 10-325 MG PO TABS: 1.0000 | ORAL_TABLET | Freq: Four times a day (QID) | ORAL | Status: DC | PRN

## 2016-05-09 NOTE — Telephone Encounter (Signed)
05/09/2016 Pt is requesting a RX for pain, pt rescheduled her surgery date to the end of this month.

## 2016-05-25 ENCOUNTER — Encounter: Payer: Commercial Managed Care - HMO | Admitting: Podiatry

## 2016-05-31 ENCOUNTER — Telehealth: Payer: Self-pay | Admitting: Certified Nurse Midwife

## 2016-06-01 ENCOUNTER — Encounter: Payer: Self-pay | Admitting: Certified Nurse Midwife

## 2016-06-01 ENCOUNTER — Ambulatory Visit (INDEPENDENT_AMBULATORY_CARE_PROVIDER_SITE_OTHER): Payer: Commercial Managed Care - HMO | Admitting: Certified Nurse Midwife

## 2016-06-01 ENCOUNTER — Other Ambulatory Visit: Payer: Self-pay | Admitting: Podiatry

## 2016-06-01 VITALS — BP 120/80 | HR 72 | Resp 16 | Ht 68.0 in | Wt 186.0 lb

## 2016-06-01 DIAGNOSIS — N760 Acute vaginitis: Secondary | ICD-10-CM

## 2016-06-01 MED ORDER — NABUMETONE 500 MG PO TABS: 500.0000 mg | ORAL_TABLET | Freq: Two times a day (BID) | ORAL | Status: DC

## 2016-06-01 MED ORDER — OXYCODONE-ACETAMINOPHEN 10-325 MG PO TABS: 1.0000 | ORAL_TABLET | Freq: Four times a day (QID) | ORAL | Status: DC | PRN

## 2016-06-01 NOTE — Progress Notes (Signed)
35 y.o. Single Caucasian female [phone] here with complaint of vaginal symptoms of increase discharge. Describes discharge as white, slight odorous.  Onset of symptoms 3 days ago. Denies new personal products or vaginal dryness. Partner uses different products frequently and feels "this might be the problem. Patient was treated with Metrogel 6 weeks ago for chronic BV and has been using Boric acid capsules for 3 days after menses. She used this last pm. No STD concerns. Urinary symptoms none . Contraception is Nexplanon. Patient wonders if this related to contraception, did not have this with Implanon use that she remembers several years ago. This is her 3rd implant. 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 No other health issues today.   O:Healthy female WDWN Affect: normal, orientation x 3  Exam: Abdomen:soft Lymph node: no enlargement or tenderness Pelvic exam: External genital: normal female, no cracking or lesions or blisters BUS: negative Vagina: scant whte discharge noted. Ph: 4.5  , Affirm taken Cervix: normal, non tender, no CMT Uterus: normal, non tender Adnexa:normal, non tender, no masses or fullness noted    A:Normal pelvic exam Discussed finding of discharge and agree partner changing products could be causing irritation, especially with sexual activity 2-3 times daily. Boric acid capsule use for chronic BV   P:. Discussed Aveeno or baking soda sitz bath for comfort. Avoid moist clothes or pads for extended period of time. If working out in gym clothes or swim suits for long periods of time change underwear or bottoms of swimsuit if possible. Coconut Oil use for skin protection prior to activity can be used to external skin for protection or dryness. Continue Boric Acid use for 2 more days as prescribed, have partner use same products as she uses and see if this resolves. Will treat per affirm. Consider contraception change. Information given on other  contraception options. Also trial of OCP to see if this may help with hormone profile. Patient will advise  Rv prn

## 2016-06-02 ENCOUNTER — Other Ambulatory Visit: Payer: Self-pay | Admitting: Nurse Practitioner

## 2016-06-02 DIAGNOSIS — M216X9 Other acquired deformities of unspecified foot: Secondary | ICD-10-CM | POA: Diagnosis not present

## 2016-06-02 HISTORY — PX: OTHER SURGICAL HISTORY: SHX169

## 2016-06-02 LAB — WET PREP BY MOLECULAR PROBE
Candida species: NEGATIVE
Gardnerella vaginalis: POSITIVE — AB
Trichomonas vaginosis: NEGATIVE

## 2016-06-02 MED ORDER — FLUCONAZOLE 150 MG PO TABS: 150.0000 mg | ORAL_TABLET | Freq: Once | ORAL | Status: DC

## 2016-06-02 MED ORDER — CLINDAMYCIN PHOSPHATE 2 % VA CREA: TOPICAL_CREAM | VAGINAL | Status: DC

## 2016-06-02 NOTE — Progress Notes (Signed)
Reviewed personally.  M. Suzanne Arch Methot, MD.  

## 2016-06-05 ENCOUNTER — Ambulatory Visit: Payer: Commercial Managed Care - HMO | Admitting: Nurse Practitioner

## 2016-06-08 ENCOUNTER — Encounter: Payer: Self-pay | Admitting: Podiatry

## 2016-06-08 ENCOUNTER — Ambulatory Visit (INDEPENDENT_AMBULATORY_CARE_PROVIDER_SITE_OTHER): Payer: Commercial Managed Care - HMO | Admitting: Podiatry

## 2016-06-08 DIAGNOSIS — Z9889 Other specified postprocedural states: Secondary | ICD-10-CM

## 2016-06-08 NOTE — Progress Notes (Signed)
1 week post op following Cotton osteotomy with bone graft left foot, 06/02/16. Has occasional pain and deals with pain medication. Patient is in cast without difficulty. Able to ambulate with cast on. Wants to return to work 4 hours/day in [employer]. Ok to try starting 06/12/16. Return in 2 weeks.

## 2016-06-08 NOTE — Patient Instructions (Signed)
One week post op and managing well. Wants to return to work with limitation. Ok to return and use Scooter 4 hours per day.

## 2016-06-14 ENCOUNTER — Encounter: Payer: Self-pay | Admitting: *Deleted

## 2016-06-16 ENCOUNTER — Ambulatory Visit (INDEPENDENT_AMBULATORY_CARE_PROVIDER_SITE_OTHER): Payer: Commercial Managed Care - HMO | Admitting: Podiatry

## 2016-06-16 ENCOUNTER — Encounter: Payer: Self-pay | Admitting: Podiatry

## 2016-06-16 DIAGNOSIS — Z9889 Other specified postprocedural states: Secondary | ICD-10-CM | POA: Diagnosis not present

## 2016-06-16 DIAGNOSIS — S99922A Unspecified injury of left foot, initial encounter: Secondary | ICD-10-CM | POA: Diagnosis not present

## 2016-06-16 DIAGNOSIS — M21969 Unspecified acquired deformity of unspecified lower leg: Secondary | ICD-10-CM

## 2016-06-16 MED ORDER — OXYCODONE-ACETAMINOPHEN 10-325 MG PO TABS
1.0000 | ORAL_TABLET | Freq: Four times a day (QID) | ORAL | Status: DC | PRN
Start: 2016-06-16 — End: 2016-06-29

## 2016-06-16 NOTE — Patient Instructions (Signed)
Pain in post op foot left following an incident, Scooter jammed against uneven ground. Cast removed. Wound healing is normal. X-ray finding is normal. Cast replaced. Pain medication re ordered. Return in 3 weeks.

## 2016-06-16 NOTE — Progress Notes (Signed)
2 weeks post op. Patient relates an incident that she almost filliped over from scooter due to an uneven ground She jammed her both feet and been hurting since. She had to take extra pain medication.   Cast removed. Wound is clean and dry. No edema or erythema noted. X-ray finding is normal without any change in the surgical site. Grafted bone in place with good bone to bone contact.  No disturbance noted. Wound redressed. Left lower limb placed back in fiber glass cast. Return in 2 weesk.

## 2016-06-19 NOTE — Telephone Encounter (Signed)
Message not needed. °

## 2016-06-22 ENCOUNTER — Encounter: Payer: Commercial Managed Care - HMO | Admitting: Podiatry

## 2016-06-23 ENCOUNTER — Ambulatory Visit: Payer: Commercial Managed Care - HMO | Admitting: Certified Nurse Midwife

## 2016-06-28 ENCOUNTER — Emergency Department (HOSPITAL_BASED_OUTPATIENT_CLINIC_OR_DEPARTMENT_OTHER)
Admission: EM | Admit: 2016-06-28 | Discharge: 2016-06-28 | Disposition: A | Discharge status: Home / Self Care | Payer: Commercial Managed Care - HMO | Attending: Emergency Medicine | Admitting: Emergency Medicine

## 2016-06-28 ENCOUNTER — Encounter (HOSPITAL_BASED_OUTPATIENT_CLINIC_OR_DEPARTMENT_OTHER): Payer: Self-pay | Admitting: *Deleted

## 2016-06-28 DIAGNOSIS — Z79899 Other long term (current) drug therapy: Secondary | ICD-10-CM | POA: Diagnosis not present

## 2016-06-28 DIAGNOSIS — Z4689 Encounter for fitting and adjustment of other specified devices: Secondary | ICD-10-CM | POA: Diagnosis not present

## 2016-06-28 DIAGNOSIS — Z4789 Encounter for other orthopedic aftercare: Secondary | ICD-10-CM

## 2016-06-28 DIAGNOSIS — M79672 Pain in left foot: Secondary | ICD-10-CM | POA: Diagnosis present

## 2016-06-28 DIAGNOSIS — Z87891 Personal history of nicotine dependence: Secondary | ICD-10-CM | POA: Diagnosis not present

## 2016-06-28 DIAGNOSIS — I10 Essential (primary) hypertension: Secondary | ICD-10-CM | POA: Diagnosis not present

## 2016-06-28 NOTE — ED Provider Notes (Signed)
[facility] DEPT [facility] Provider Note   CSN: [id number] Arrival date & time: 06/28/16  1737  By signing my name below, I, Irene Pap, attest that this documentation has been prepared under the direction and in the presence of Malvin Johns, MD. Electronically Signed: Irene Pap, ED Scribe. 06/28/16. 6:24 PM.  First Provider Contact:    History   Chief Complaint No chief complaint on file.   The history is provided by the patient. No language interpreter was used.  HPI Comments: Lindsey Ruiz is a 35 y.o. female with a hx of HTN who presents to the Emergency Department complaining of getting the cast on the left foot wet onset earlier today. Pt is s/p left foot surgery on June 30th by a podiatrist. She got the bottom of her foot/cast wet while taking a shower. She called the orthopedist and was told to blow-dry the cast but does not believe this was effective. She notes cracks to the bottom of the cast. Pt is able to weightbear on the foot. Pt is due to have her cast off next week. She denies numbness or weakness to the area.    Past Medical History:  Diagnosis Date  . Anxiety   . Hypertension   . Migraine     Patient Active Problem List   Diagnosis Date Noted  . Status post right foot surgery 11/25/2015  . Tenosynovitis of right foot 11/11/2015  . 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 Pain in lower limb 10/22/2015  . 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 Panic attacks 09/28/2014  . Anxiety 09/28/2014  . Chronic back pain 09/28/2014    Past Surgical History:  Procedure Laterality Date  . breast cyst    . BREAST LUMPECTOMY    . BUNIONECTOMY    . Cotton Osteotomy w/ Bone Graft Right 11/19/2015   RT FOOT  . Cotton Osteotomy w/ Bone Graft Left 03/02/2016   LT FOOT  . nexplanon insertion     inserted 09-06-15  . TONSILLECTOMY AND ADENOIDECTOMY      OB History    Gravida Para Term  Preterm AB Living   5 2 2   3 2    SAB TAB Ectopic Multiple Live Births   1 2             Home Medications    Prior to Admission medications   Medication Sig Start Date End Date Taking? Authorizing Provider  ALPRAZolam Duanne Moron) 0.5 MG tablet Take ONE (1) tablet by mouth twice daily 12/21/15  Yes Historical Provider, MD  etonogestrel (NEXPLANON) 68 MG IMPL implant 1 each by Subdermal route once.   Yes Historical Provider, MD  nabumetone (RELAFEN) 500 MG tablet Take 1 tablet (500 mg total) by mouth 2 (two) times daily. 06/01/16  Yes Myeong O Sheard, DPM  oxyCODONE-acetaminophen (PERCOCET) 10-325 MG tablet Take 1 tablet by mouth every 6 (six) hours as needed for pain. 06/16/16  Yes Myeong O Sheard, DPM  topiramate (TOPAMAX) 100 MG tablet Take 100 mg by mouth 2 (two) times daily.   Yes Historical Provider, MD  valACYclovir (VALTREX) 500 MG tablet Two tablets bid x 10 days then one tablet daily 03/30/16  Yes Regina Eck, CNM  valsartan-hydrochlorothiazide (DIOVAN-HCT) 80-12.5 MG tablet Take ONE (1) tablet by mouth daily 01/19/16  Yes Historical Provider, MD  clindamycin (CLEOCIN) 2 % vaginal cream Use nightly X 5 06/02/16   Kem Boroughs, FNP  fluconazole (DIFLUCAN) 150 MG  tablet Take 1 tablet (150 mg total) by mouth once. Take one tablet.  Repeat in 48 hours if symptoms are not completely resolved. 06/02/16   Kem Boroughs, FNP    Family History Family History  Problem Relation Age of Onset  . Breast cancer Mother   . Lung cancer Paternal Aunt   . Diabetes Maternal Grandmother   . Hypertension Maternal Grandfather   . Heart disease Maternal Grandfather     chf  . Esophageal cancer Paternal Grandmother   . Hypertension Paternal Grandfather   . Heart attack Paternal Grandfather   . Diabetes Paternal Aunt     Social History Social History  Substance Use Topics  . Smoking status: Former Research scientist (life sciences)  . Smokeless tobacco: Never Used  . Alcohol use No     Allergies   Pineapple;  Penicillins; Sulfa antibiotics; and Sumatriptan   Review of Systems Review of Systems  Constitutional: Negative for fever.  Gastrointestinal: Negative for nausea and vomiting.  Musculoskeletal: Negative for arthralgias, back pain, joint swelling and neck pain.  Skin: Negative for wound.  Neurological: Negative for weakness, numbness and headaches.     Physical Exam Updated Vital Signs BP (!) 175/107 (BP Location: Left Arm)   Pulse 89   Temp 98.9 F (37.2 C) (Oral)   Resp 16   Ht 5\' 8"  (1.727 m)   Wt 184 lb (83.5 kg)   LMP 06/14/2016   SpO2 100%   BMI 27.98 kg/m   Physical Exam  Constitutional: She is oriented to person, place, and time. She appears well-developed and well-nourished.  HENT:  Head: Normocephalic and atraumatic.  Neck: Normal range of motion. Neck supple.  Cardiovascular: Normal rate.   Pulmonary/Chest: Effort normal.  Musculoskeletal: She exhibits no edema or tenderness.  Patient has a short leg cast to the left leg. Her toes have good perfusion. The cast is cracked on the bottom and wet.  Neurological: She is alert and oriented to person, place, and time.  Skin: Skin is warm and dry.  Psychiatric: She has a normal mood and affect.     ED Treatments / Results  DIAGNOSTIC STUDIES: Oxygen Saturation is 100% on RA, normal by my interpretation.    COORDINATION OF CARE: 6:21 PM-Discussed treatment plan which includes follow up with podiatrist with pt at bedside and pt agreed to plan.    Labs (all labs ordered are listed, but only abnormal results are displayed) Labs Reviewed - No data to display  EKG  EKG Interpretation None       Radiology No results found.  Procedures Procedures (including critical care time)  Medications Ordered in ED Medications - No data to display   Initial Impression / Assessment and Plan / ED Course  I have reviewed the triage vital signs and the nursing notes.  Pertinent labs & imaging results that were  available during my care of the patient were reviewed by me and considered in my medical decision making (see chart for details).  Clinical Course    Patient's cast was wet and cracked. It was removed in the ED. A posterior splint was placed. She was advised to contact her podiatrist tomorrow to see if they want to replace the cast. She also has a walking boot at home. I advised her to see if her podiatrist wanted to use the walking boot or replace the cast.  Final Clinical Impressions(s) / ED Diagnoses   Final diagnoses:  Cast discomfort  I personally performed the services described in this  documentation, which was scribed in my presence.  The recorded information has been reviewed and considered.    New Prescriptions New Prescriptions   No medications on file     Malvin Johns, MD 06/28/16 [phone]

## 2016-06-28 NOTE — ED Notes (Signed)
Pt states she had surgery on her left foot on June 30th. Got cast wet today and wants it off.

## 2016-06-28 NOTE — ED Notes (Signed)
CMS intact before and after. Pt tolerated well. Pt had no questions.  

## 2016-06-28 NOTE — ED Triage Notes (Signed)
States she has a cast on left leg and has been taking a shower with garbage bag tied at the top to keep it from getting wet. Today she got the inside of her cast wet. Called her MD and suggested blow drying cast. Pt did not feel this worked.  She is supposed to have cast removed next Thursday.

## 2016-06-28 NOTE — Discharge Instructions (Signed)
Contact your podiatrist tomorrow to see if they want to replace her cast versus using your walking boot.

## 2016-06-28 NOTE — ED Notes (Signed)
MD at bedside. 

## 2016-06-29 ENCOUNTER — Ambulatory Visit (INDEPENDENT_AMBULATORY_CARE_PROVIDER_SITE_OTHER): Payer: Commercial Managed Care - HMO | Admitting: Podiatry

## 2016-06-29 ENCOUNTER — Encounter: Payer: Self-pay | Admitting: Podiatry

## 2016-06-29 DIAGNOSIS — M659 Synovitis and tenosynovitis, unspecified: Secondary | ICD-10-CM

## 2016-06-29 DIAGNOSIS — M21969 Unspecified acquired deformity of unspecified lower leg: Secondary | ICD-10-CM | POA: Diagnosis not present

## 2016-06-29 DIAGNOSIS — M79604 Pain in right leg: Secondary | ICD-10-CM

## 2016-06-29 DIAGNOSIS — M79671 Pain in right foot: Secondary | ICD-10-CM

## 2016-06-29 DIAGNOSIS — M65971 Unspecified synovitis and tenosynovitis, right ankle and foot: Secondary | ICD-10-CM

## 2016-06-29 MED ORDER — OXYCODONE-ACETAMINOPHEN 10-325 MG PO TABS: 1.0000 | ORAL_TABLET | Freq: Four times a day (QID) | ORAL | 0 refills | Status: DC | PRN

## 2016-06-29 NOTE — Patient Instructions (Signed)
May use CAM walker with posterior splint.

## 2016-06-29 NOTE — Progress Notes (Signed)
4 weeks post op (Cotton osteotomy with bone graft 06/02/16).  Stated that she had her cast wet and went to ER and had it removed. Patient came in wearing posterior splint. Otherwise her foot is the same as the last week's visit. Posterior splint is firm and securing the lower limb. Placed in CAM walker with splint on. Increased work hour to 6 hours per day as requested.  Return in 3 weeks.

## 2016-07-06 ENCOUNTER — Encounter: Payer: Commercial Managed Care - HMO | Admitting: Podiatry

## 2016-07-11 ENCOUNTER — Telehealth: Payer: Self-pay | Admitting: *Deleted

## 2016-07-11 NOTE — Telephone Encounter (Signed)
07/11/2016 Dr. Caffie Pinto, Patient called this afternoon, she has lost her return to work note that she received here, she ask can you rewrite her note and put return to work date as of today, 07/11/2016 and working 6 hours also.

## 2016-07-12 ENCOUNTER — Encounter: Payer: Self-pay | Admitting: Podiatry

## 2016-07-20 ENCOUNTER — Encounter: Payer: Self-pay | Admitting: Podiatry

## 2016-07-20 ENCOUNTER — Ambulatory Visit (INDEPENDENT_AMBULATORY_CARE_PROVIDER_SITE_OTHER): Payer: Commercial Managed Care - HMO | Admitting: Podiatry

## 2016-07-20 DIAGNOSIS — Z9889 Other specified postprocedural states: Secondary | ICD-10-CM

## 2016-07-20 MED ORDER — OXYCODONE-ACETAMINOPHEN 10-325 MG PO TABS: 1.0000 | ORAL_TABLET | Freq: Four times a day (QID) | ORAL | 0 refills | Status: DC | PRN

## 2016-07-20 NOTE — Patient Instructions (Signed)
7 weeks post op doing well other than pain from being stepped on by a family member. May try regular tennis shoes with orthotic in it as tolerated. Return in 3 weeks.

## 2016-07-20 NOTE — Progress Notes (Signed)
7 weeks post op (Cotton osteotomy with bone graft 06/02/16).  Having pain since her grandmother stepped on her foot that had sugery. Otherwise doing well in CAM walker.  Placed in CAM walker with splint on. Increased work hour to 6 hours per day as requested.  Return in 3 weeks.

## 2016-08-08 ENCOUNTER — Telehealth: Payer: Self-pay | Admitting: *Deleted

## 2016-08-08 MED ORDER — OXYCODONE-ACETAMINOPHEN 10-325 MG PO TABS: 1.0000 | ORAL_TABLET | Freq: Four times a day (QID) | ORAL | 0 refills | Status: DC | PRN

## 2016-08-08 NOTE — Telephone Encounter (Signed)
08/08/16 Pt request a rx for pain today, she has an appointrment for this Thursday but needs the medicine because of foot pain.

## 2016-08-10 ENCOUNTER — Encounter: Payer: Self-pay | Admitting: Podiatry

## 2016-08-10 ENCOUNTER — Ambulatory Visit (INDEPENDENT_AMBULATORY_CARE_PROVIDER_SITE_OTHER): Payer: Commercial Managed Care - HMO | Admitting: Podiatry

## 2016-08-10 DIAGNOSIS — Z9889 Other specified postprocedural states: Secondary | ICD-10-CM

## 2016-08-10 NOTE — Patient Instructions (Signed)
Follow up on left foot post op foot. Managing 6 hours on feet at work.  Using pain medication as needed. Return in 3 weeks.

## 2016-08-10 NOTE — Progress Notes (Signed)
Patient came in wearing slippers stating that the foot hurt. This is 10 weeks status post Cotton osteotomy left foot (06/02/16). She was not able to get her pain pills due to Medicaid restriction.  Both feet are hurting at top of foot and the side of foot left, aching feeling on right mid foot. Thinks it is possible due to hard concrete floor.  Been working 6 hours per day, which she is able to manage at this time. She does not think she is ready to increase work hour yet.   No new changes. Post op wound healing complete with maintained correction left foot.  Post op pain, unable to manage to be on feet more than 6 hours.  Advised to wear lace up tennis shoes during the day, limit slippers in the house only.  Return as needed.

## 2016-08-21 ENCOUNTER — Telehealth: Payer: Self-pay | Admitting: *Deleted

## 2016-08-21 MED ORDER — OXYCODONE-ACETAMINOPHEN 10-325 MG PO TABS: 1.0000 | ORAL_TABLET | Freq: Four times a day (QID) | ORAL | 0 refills | Status: DC | PRN

## 2016-08-21 NOTE — Telephone Encounter (Signed)
08/21/2016 Dr.Sheard, Patient called this afternoon and request a RX for pain please.

## 2016-08-30 ENCOUNTER — Ambulatory Visit: Payer: Commercial Managed Care - HMO | Admitting: Podiatry

## 2016-09-06 ENCOUNTER — Telehealth: Payer: Self-pay | Admitting: *Deleted

## 2016-09-06 MED ORDER — OXYCODONE-ACETAMINOPHEN 10-325 MG PO TABS: 1.0000 | ORAL_TABLET | Freq: Four times a day (QID) | ORAL | 0 refills | Status: DC | PRN

## 2016-09-06 NOTE — Telephone Encounter (Signed)
09/06/16 Dr. Caffie Pinto, Pt called this am and ask can you refill her Prescription for pain and also states the Pharmacist if you put a DX(code) they can refill the whole 60 pills??

## 2016-09-25 ENCOUNTER — Telehealth: Payer: Self-pay | Admitting: *Deleted

## 2016-09-25 NOTE — Telephone Encounter (Signed)
09/25/16 Dr.Sheard, Patient called this am and ask can you refill her pain meds and also can you write a note saying patient can work 6 to 8 hours depending on how she feels, Her work wants her to go to 8 hours

## 2016-09-26 ENCOUNTER — Ambulatory Visit (INDEPENDENT_AMBULATORY_CARE_PROVIDER_SITE_OTHER): Payer: Commercial Managed Care - HMO | Admitting: Podiatry

## 2016-09-26 ENCOUNTER — Encounter: Payer: Self-pay | Admitting: Podiatry

## 2016-09-26 VITALS — BP 150/96 | HR 88

## 2016-09-26 DIAGNOSIS — R600 Localized edema: Secondary | ICD-10-CM | POA: Diagnosis not present

## 2016-09-26 DIAGNOSIS — M79672 Pain in left foot: Secondary | ICD-10-CM | POA: Diagnosis not present

## 2016-09-26 MED ORDER — OXYCODONE-ACETAMINOPHEN 10-325 MG PO TABS: 1.0000 | ORAL_TABLET | Freq: Four times a day (QID) | ORAL | 0 refills | Status: DC | PRN

## 2016-09-26 NOTE — Patient Instructions (Signed)
Seen for pain in left foot. Continue limited work hour to 6hr/day, but increase 8hr/day if able to tolerate. Return as needed.

## 2016-09-26 NOTE — Progress Notes (Signed)
Subjective: 4 months status post Cotton osteotomy left foot (06/02/16). Patient stated that her Left foot hurts through out on bottom and goes around the top of the foot. She noted some swelling and puffing up at old incision area of left foot.  Right foot also aches but not as bad as the left one.  She is sitting down at work. Still wants to work 6 hours daily but limitation on 8 hours only if she can olerated.   Patient has history of an incident in August when her grandma in wheelchair, stepped down on top of Lindsey Ruiz's left foot.  Objective: No new changes. Post op wound healing complete with maintained correction in both feet.  Post op pain on left, unable to manage work more than 6 hours.  Assessment: Normal post op recovery with residual foot pain on left. Sporadic edema left foot.   Plan: Advised to wear lace up tennis shoes and increase activity as tolerated.  May increase to 8 work hours if able to tolerate. Letter for work restriction written.  Pain medication prescribed as per request.  Return as needed.

## 2016-10-09 ENCOUNTER — Telehealth: Payer: Self-pay | Admitting: *Deleted

## 2016-10-09 MED ORDER — OXYCODONE-ACETAMINOPHEN 10-325 MG PO TABS: 1.0000 | ORAL_TABLET | Freq: Four times a day (QID) | ORAL | 0 refills | Status: DC | PRN

## 2016-10-09 NOTE — Telephone Encounter (Signed)
10/09/16 Dr.Sheard, Patient called this morning and says she is out of her pain medicine, can you put the code on it she says like you did last time so she can get the whole  prescription, if not she will only get a 14 day supply.

## 2016-10-30 ENCOUNTER — Telehealth: Payer: Self-pay | Admitting: *Deleted

## 2016-10-30 MED ORDER — OXYCODONE-ACETAMINOPHEN 10-325 MG PO TABS: 1.0000 | ORAL_TABLET | Freq: Four times a day (QID) | ORAL | 0 refills | Status: DC | PRN

## 2016-10-30 NOTE — Telephone Encounter (Signed)
10/30/16 Patient called this am and ask can you refill her RX, also she stared a job at [employer], can you write her a note asking can she sit down some while she is at work.

## 2016-11-21 ENCOUNTER — Encounter: Payer: Self-pay | Admitting: Podiatry

## 2016-11-21 ENCOUNTER — Ambulatory Visit: Payer: Commercial Managed Care - HMO | Admitting: Podiatry

## 2016-11-21 ENCOUNTER — Ambulatory Visit (INDEPENDENT_AMBULATORY_CARE_PROVIDER_SITE_OTHER): Payer: Commercial Managed Care - HMO | Admitting: Podiatry

## 2016-11-21 VITALS — BP 132/90 | HR 92

## 2016-11-21 DIAGNOSIS — M79672 Pain in left foot: Secondary | ICD-10-CM | POA: Diagnosis not present

## 2016-11-21 DIAGNOSIS — S99922D Unspecified injury of left foot, subsequent encounter: Secondary | ICD-10-CM

## 2016-11-21 DIAGNOSIS — R600 Localized edema: Secondary | ICD-10-CM | POA: Diagnosis not present

## 2016-11-21 MED ORDER — OXYCODONE-ACETAMINOPHEN 10-325 MG PO TABS: 1.0000 | ORAL_TABLET | Freq: Four times a day (QID) | ORAL | 0 refills | Status: DC | PRN

## 2016-11-21 NOTE — Patient Instructions (Signed)
Seen for injured left foot. X-ray show no abnormal findings. Una boot applied with Coban. Use CAM walker till return time for ambulation. Return in one week.

## 2016-11-21 NOTE — Progress Notes (Signed)
Subjective: 5 and a 1/2 months status post Cotton osteotomy left foot (06/02/16). Patient is working 2 jobs. She dropped a pallet of soap box on left foot. Now having pain at just above the anterior ankle joint and anterior inferior to lateral malleoli area near CCJ of left. Incident happened in 11/17/16. She is able to walk and been back to work at [employer]. She is able to take breaks every 2 hours according to restriction issued 2 weeks ago.  She sits down at her other full time job.   Objective: No new changes. Post op wound healing complete with maintained correction in both feet.  Post op pain on left, unable to manage work more than 6 hours.  Assessment: Normal post op recovery with residual foot pain on left. Sporadic edema left foot.  Pain with prolonged weight bearing.   Plan: Advised to wear lace up tennis shoes and increase activity as tolerated.  May increase to 8 work hours if able to tolerate. Letter for work restriction written.  Pain medication prescribed as per request.  Return as needed.

## 2016-11-28 ENCOUNTER — Ambulatory Visit: Payer: Commercial Managed Care - HMO | Admitting: Podiatry

## 2016-11-29 ENCOUNTER — Ambulatory Visit (INDEPENDENT_AMBULATORY_CARE_PROVIDER_SITE_OTHER): Payer: Commercial Managed Care - HMO | Admitting: Podiatry

## 2016-11-29 ENCOUNTER — Telehealth: Payer: Self-pay | Admitting: Certified Nurse Midwife

## 2016-11-29 DIAGNOSIS — M79672 Pain in left foot: Secondary | ICD-10-CM

## 2016-11-29 DIAGNOSIS — M21969 Unspecified acquired deformity of unspecified lower leg: Secondary | ICD-10-CM

## 2016-11-29 DIAGNOSIS — M216X9 Other acquired deformities of unspecified foot: Secondary | ICD-10-CM

## 2016-11-29 NOTE — Patient Instructions (Signed)
Follow up on left foot pain. Able to withstand more hours on feet using CAM walker. Having pain in right heel. May be due to uneven weight bearing and leaning on right foot. Ankle brace in place of CAM walker may help.  Use Ankle brace as needed. Return as needed.

## 2016-11-29 NOTE — Telephone Encounter (Signed)
Patietn thinks she has bv again and wants something called into the pharmacy.

## 2016-11-29 NOTE — Progress Notes (Signed)
   Subjective: 6 months status post Cotton osteotomy left foot (06/02/16). 1 week follow up on left foot pain. The wrap got wet last night and had to remove it. She did well till she over worked 9 hours on her feet yesterday. Been taking pain medication one q 6 hours.   HPI: Patient is working 2 jobs. She dropped a pallet of soap box on left foot on 11/17/16. Now having pain at just above the anterior ankle joint and anterior inferior to lateral malleoli area near CCJ of left.   Objective: No new changes. Post op wound healing complete with maintained correction in both feet.  Post op pain on left, unable to manage work more than 6 hours.  Assessment: Normal post op recovery with residual foot pain on left. Sporadic edema left foot.  Pain with prolonged weight bearing.   Plan: Advised to wear lace up tennis shoes.  Patient understands she need to decrease her work hour on feet.  Pain medication prescribed as per request.  Return as needed.

## 2016-11-29 NOTE — Telephone Encounter (Signed)
Spoke with patient. Patient states she believes she has BV again. Patient states she started noticing vaginal discharge with odor on 12/26 and is unsure of color of discharge. Patient denies vaginal itching, burning or urinary complaints. Patient denies any new sexual partners. Patient states she has started using a aromatherapy soap from [employer] approximately 1 week ago. Patient last seen in office 06/01/16. Recommended OV for further evaluation. Patient scheduled for 11/30/16 at 12:45pm with Melvia Heaps, CNM. Patient agreeable to date and time.  Routing to provider for final review. Patient is agreeable to disposition. Will close encounter.

## 2016-11-30 ENCOUNTER — Ambulatory Visit (INDEPENDENT_AMBULATORY_CARE_PROVIDER_SITE_OTHER): Payer: Commercial Managed Care - HMO | Admitting: Certified Nurse Midwife

## 2016-11-30 ENCOUNTER — Encounter: Payer: Self-pay | Admitting: Certified Nurse Midwife

## 2016-11-30 VITALS — BP 118/70 | HR 88 | Resp 16 | Wt 185.0 lb

## 2016-11-30 DIAGNOSIS — N760 Acute vaginitis: Secondary | ICD-10-CM

## 2016-11-30 NOTE — Progress Notes (Signed)
35 y.o. Single African American female [phone] here with complaint of vaginal symptoms of odor and  increase discharge. Describes discharge watery with odor. Onset of symptoms 2 days ago. Denies new personal products or vaginal dryness. Took a Diflucan she had 2 weeks ago with some relief. Also used Metrogel last pm she had with no change in discharge. Patient has been using essential oils bath products when symptoms started. No STD concerns. Urinary symptoms none . Contraception is Nexplanon.    O:Healthy female WDWN Affect: normal, orientation x 3  Exam: Abdomen: soft, non tender  Inguinal Lymph nodes: no enlargement or tenderness Pelvic exam: External genital: normal female, no scaling or exudate, slight cracking at introitus only, non tender BUS: negative Vagina: watery clear discharge noted. Ph: 5.0 ,Wet prep taken,  Cervix: normal, non tender, no CMT Uterus: normal, non tender Adnexa:normal, non tender, no masses or fullness noted   Wet Prep results: KOH,Saline + for clue cells   A:Normal pelvic exam R/O vaginal infection History of chronic vulvovaginitis    P:Discussed findings of normal pelvic exam. Reinforced the importance of not mixing products used in the vaginal area, which increases the risk of vulvitis and vaginitis. Discussed Aveeno or baking soda sitz bath for comfort, until test results back. Patient voiced understanding.  Rv prn

## 2016-12-01 ENCOUNTER — Other Ambulatory Visit: Payer: Self-pay | Admitting: Certified Nurse Midwife

## 2016-12-01 DIAGNOSIS — N760 Acute vaginitis: Principal | ICD-10-CM

## 2016-12-01 DIAGNOSIS — B9689 Other specified bacterial agents as the cause of diseases classified elsewhere: Secondary | ICD-10-CM

## 2016-12-01 LAB — WET PREP BY MOLECULAR PROBE
CANDIDA SPECIES: NEGATIVE
GARDNERELLA VAGINALIS: POSITIVE — AB
Trichomonas vaginosis: NEGATIVE

## 2016-12-01 MED ORDER — CLINDAMYCIN PHOSPHATE 2 % VA CREA: 1.0000 | TOPICAL_CREAM | Freq: Every day | VAGINAL | 0 refills | Status: DC

## 2016-12-01 NOTE — Progress Notes (Signed)
Reviewed personally.  M. Suzanne Layth Cerezo, MD.  

## 2016-12-01 NOTE — Progress Notes (Unsigned)
Resent Clindamycin cream to [facility] in [place] per patient's request. [facility] has closed.

## 2016-12-06 ENCOUNTER — Encounter (HOSPITAL_BASED_OUTPATIENT_CLINIC_OR_DEPARTMENT_OTHER): Payer: Self-pay | Admitting: *Deleted

## 2016-12-06 ENCOUNTER — Emergency Department (HOSPITAL_BASED_OUTPATIENT_CLINIC_OR_DEPARTMENT_OTHER)
Admission: EM | Admit: 2016-12-06 | Discharge: 2016-12-06 | Disposition: A | Discharge status: Home / Self Care | Payer: Commercial Managed Care - HMO | Attending: Dermatology | Admitting: Dermatology

## 2016-12-06 DIAGNOSIS — I10 Essential (primary) hypertension: Secondary | ICD-10-CM | POA: Insufficient documentation

## 2016-12-06 DIAGNOSIS — Z5321 Procedure and treatment not carried out due to patient leaving prior to being seen by health care provider: Secondary | ICD-10-CM | POA: Diagnosis not present

## 2016-12-06 DIAGNOSIS — Z87891 Personal history of nicotine dependence: Secondary | ICD-10-CM | POA: Diagnosis not present

## 2016-12-06 DIAGNOSIS — R05 Cough: Secondary | ICD-10-CM | POA: Insufficient documentation

## 2016-12-06 NOTE — ED Triage Notes (Signed)
Pt reports cough and congestion x Friday night, this am had nosebleed, resolved at triage.

## 2016-12-06 NOTE — ED Notes (Signed)
Pt seen states she wants to leave, encouraged to remain in ed for md eval. Pt states "my doctor is right up the street, I'll go see him. I don't want to pay for an er visit anyway." pt leaves ed amb in nad, despite encouragement by this rn to remain in ed for md eval.

## 2016-12-07 ENCOUNTER — Encounter: Payer: Self-pay | Admitting: Podiatry

## 2016-12-13 ENCOUNTER — Telehealth: Payer: Self-pay | Admitting: *Deleted

## 2016-12-13 NOTE — Telephone Encounter (Signed)
12/13/2016 Pt called this afternoon and left a message and ask can you refill her pain medication. Pt also wants a letter for her work regarding hours. We need more information, I called pt and left a message for her to call us tomorrow.

## 2016-12-14 ENCOUNTER — Ambulatory Visit (INDEPENDENT_AMBULATORY_CARE_PROVIDER_SITE_OTHER): Payer: 59 | Admitting: Podiatry

## 2016-12-14 DIAGNOSIS — M79672 Pain in left foot: Secondary | ICD-10-CM

## 2016-12-14 DIAGNOSIS — M21969 Unspecified acquired deformity of unspecified lower leg: Secondary | ICD-10-CM

## 2016-12-14 DIAGNOSIS — R600 Localized edema: Secondary | ICD-10-CM

## 2016-12-14 MED ORDER — OXYCODONE-ACETAMINOPHEN 10-325 MG PO TABS: 1.0000 | ORAL_TABLET | Freq: Three times a day (TID) | ORAL | 0 refills | Status: DC | PRN

## 2016-12-14 NOTE — Patient Instructions (Signed)
Came in for FLMA paper to be filled out and prescription for pain medication. Return as needed.

## 2016-12-14 NOTE — Progress Notes (Signed)
Subjective: 80months status post Cotton osteotomy left foot (06/02/16). Patient wants FLMA paper to be filled out.  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   HPI: Patient is working 2 jobs. She dropped a pallet of soap box on left foot on 11/17/16. Now having pain at just above the anterior ankle joint and anterior inferior to lateral malleoli area near CCJ of left.   Objective: No new changes. Post op wound healing complete with maintained correction in both feet.  Post op pain on left, unable to manage work more than 6 hours.  Assessment: Normal post op recovery with residual foot pain on left. Sporadic edema left foot.  Pain with prolonged weight bearing.   Plan: Advised to wear lace up tennis shoes.   Patient understands she need to decrease her work hour on feet.  FLMA form reviewed and signed. Request made to her work place that she can take a day or two a month when the foot pain flares up. Pain medication prescribed as per request.  Return as needed.

## 2016-12-15 ENCOUNTER — Encounter: Payer: Self-pay | Admitting: Podiatry

## 2016-12-19 ENCOUNTER — Ambulatory Visit: Payer: Commercial Managed Care - HMO | Admitting: Podiatry

## 2016-12-21 ENCOUNTER — Ambulatory Visit: Payer: Commercial Managed Care - HMO | Admitting: Podiatry

## 2016-12-25 ENCOUNTER — Encounter: Payer: Self-pay | Admitting: Podiatry

## 2016-12-25 ENCOUNTER — Ambulatory Visit (INDEPENDENT_AMBULATORY_CARE_PROVIDER_SITE_OTHER): Payer: Commercial Managed Care - HMO | Admitting: Podiatry

## 2016-12-25 DIAGNOSIS — L03032 Cellulitis of left toe: Secondary | ICD-10-CM | POA: Diagnosis not present

## 2016-12-25 DIAGNOSIS — M79605 Pain in left leg: Secondary | ICD-10-CM

## 2016-12-25 DIAGNOSIS — M79675 Pain in left toe(s): Secondary | ICD-10-CM | POA: Diagnosis not present

## 2016-12-25 DIAGNOSIS — L600 Ingrowing nail: Secondary | ICD-10-CM

## 2016-12-25 NOTE — Patient Instructions (Signed)
Ingrown nail surgery was done on left great toe. Follow soaking instruction.  Some redness and drainage is expected. Call the office if the area gets feverish with increased redness and drainage.  

## 2016-12-25 NOTE — Progress Notes (Signed)
Patient presents for scheduled ingrown nail surgery. Assessment: Inflamed ingrown nail left great toe medial border. Procedure performed: Phenol and Alcohol matrixectomy left great toe medial border. Affected toe was anesthetized with total 69ml mixture of 50/50 0.5% Marcaine plain and 1% Xylocaine plain. Affected nail border was reflected with a nail elevator and excised with nail nipper. Proximal nail matrix tissue was cauterized with Phenol soaked cotton applicator x 4 and neutralized with Alcohol soaked cotton applicator. The wound was dressed with Amerigel ointment dressing. Home care instructions and supply dispensed.  Return in 1 week for follow up.

## 2017-01-01 ENCOUNTER — Encounter: Payer: Commercial Managed Care - HMO | Admitting: Podiatry

## 2017-01-02 ENCOUNTER — Ambulatory Visit (INDEPENDENT_AMBULATORY_CARE_PROVIDER_SITE_OTHER): Payer: Commercial Managed Care - HMO | Admitting: Podiatry

## 2017-01-02 ENCOUNTER — Encounter: Payer: Self-pay | Admitting: Podiatry

## 2017-01-02 DIAGNOSIS — Z9889 Other specified postprocedural states: Secondary | ICD-10-CM

## 2017-01-02 MED ORDER — OXYCODONE-ACETAMINOPHEN 10-325 MG PO TABS: 1.0000 | ORAL_TABLET | Freq: Two times a day (BID) | ORAL | 0 refills | Status: DC | PRN

## 2017-01-02 NOTE — Patient Instructions (Signed)
1 week post op. Wound healing good and satisfactory.  Return as needed.

## 2017-01-02 NOTE — Progress Notes (Signed)
1 week following P&A matrixectomy left great toe medial border. Surgical site is clean, dry without any drainage or redness. Satisfactory progress. Continue with daily soak till tenderness subside. Still having pain on midfoot and in need of pain medication.

## 2017-01-05 ENCOUNTER — Encounter: Payer: Self-pay | Admitting: Certified Nurse Midwife

## 2017-01-05 ENCOUNTER — Ambulatory Visit (INDEPENDENT_AMBULATORY_CARE_PROVIDER_SITE_OTHER): Payer: Commercial Managed Care - HMO | Admitting: Certified Nurse Midwife

## 2017-01-05 VITALS — BP 110/68 | HR 64 | Resp 16 | Ht 68.0 in | Wt 183.0 lb

## 2017-01-05 DIAGNOSIS — Z124 Encounter for screening for malignant neoplasm of cervix: Secondary | ICD-10-CM | POA: Diagnosis not present

## 2017-01-05 DIAGNOSIS — Z01419 Encounter for gynecological examination (general) (routine) without abnormal findings: Secondary | ICD-10-CM

## 2017-01-05 DIAGNOSIS — Z0000 Encounter for general adult medical examination without abnormal findings: Secondary | ICD-10-CM | POA: Diagnosis not present

## 2017-01-05 NOTE — Progress Notes (Signed)
Patient scheduled for right breast diagnostic MMG and ultrasound while in office at [facility] for 01/10/17 at 11am. Patient is agreeable to date and time. Order faxed to [phone] ATTN: Maudie Mercury.

## 2017-01-05 NOTE — Patient Instructions (Signed)

## 2017-01-05 NOTE — Progress Notes (Signed)
36 y.o. [id number] Single  African American Fe here for annual exam. Periods sporadic, with Nexplanon. No partner change. STD screening desired. Sees PCP for aex/labs. Patient has noted right breast lump for the past month. Denies nipple discharge or soreness or pain. History of left breast fibroadenoma with lumpectomy. Family history of breast cancer with mother at age 11. Patient has also noted increase in vaginal discharge, but no itching or burning. No new personal products, no urinary symptoms. Social stress with house being sold due to Product/process development scientist. No other health issues today.  No LMP recorded. Patient has had an implant.          Sexually active: Yes.    The current method of family planning is nexplanon.    Exercising: Yes.    walking Smoker:  no  Health Maintenance: Pap:  04-21-13 neg HPV HR neg, 2015 neg per patient MMG:  Neg per patient Colonoscopy:  none BMD:   Had done per patient TDaP:  2011 Shingles: no Pneumonia: no Hep C and HIV: HIV neg per patient Labs: none Self breast exam: pt checks them   reports that she has quit smoking. She has never used smokeless tobacco. She reports that she does not drink alcohol or use drugs.  Past Medical History:  Diagnosis Date  . Anxiety   . Hypertension   . Migraine     Past Surgical History:  Procedure Laterality Date  . breast cyst    . BREAST LUMPECTOMY    . BUNIONECTOMY    . Cotton Osteotomy w/ Bone Graft Right 11/19/2015   RT FOOT  . Cotton Osteotomy w/ Bone Graft Left 06/02/2016   LT FOOT  . nexplanon insertion     inserted 09-06-15  . TONSILLECTOMY AND ADENOIDECTOMY      Current Outpatient Prescriptions  Medication Sig Dispense Refill  . cetirizine (ZYRTEC) 10 MG tablet Take by mouth.    . clonazePAM (KLONOPIN) 0.5 MG tablet Take by mouth.    . cyclobenzaprine (FLEXERIL) 10 MG tablet     . fluticasone (FLONASE) 50 MCG/ACT nasal spray Place into the nose.    . oxyCODONE-acetaminophen (PERCOCET) 10-325 MG  tablet Take 1 tablet by mouth every 12 (twelve) hours as needed for pain. 60 tablet 0  . PROAIR HFA 108 (90 Base) MCG/ACT inhaler INHALE 2 PUFFS TID PRN FOR WHEEZING  0  . topiramate (TOPAMAX) 100 MG tablet Take by mouth.    . valACYclovir (VALTREX) 500 MG tablet Two tablets bid x 10 days then one tablet daily    . valsartan-hydrochlorothiazide (DIOVAN-HCT) 80-12.5 MG tablet Take ONE (1) tablet by mouth daily     No current facility-administered medications for this visit.     Family History  Problem Relation Age of Onset  . Breast cancer Mother   . Lung cancer Paternal Aunt   . Diabetes Maternal Grandmother   . Hypertension Maternal Grandfather   . Heart disease Maternal Grandfather     chf  . Esophageal cancer Paternal Grandmother   . Hypertension Paternal Grandfather   . Heart attack Paternal Grandfather   . Diabetes Paternal Aunt     ROS:  Pertinent items are noted in HPI.  Otherwise, a comprehensive ROS was negative.  Exam:   BP 110/68   Pulse 64   Resp 16   Ht 5\' 8"  (1.727 m)   Wt 183 lb (83 kg)   BMI 27.83 kg/m  Height: 5\' 8"  (172.7 cm) Ht Readings from Last 3 Encounters:  01/05/17 5\' 8"  (1.727 m)  12/06/16 5\' 8"  (1.727 m)  06/28/16 5\' 8"  (1.727 m)    General appearance: alert, cooperative and appears stated age Head: Normocephalic, without obvious abnormality, atraumatic Neck: no adenopathy, supple, symmetrical, trachea midline and thyroid normal to inspection and palpation Lungs: clear to auscultation bilaterally Breasts: normal appearance, no masses or tenderness, No nipple retraction or dimpling, No nipple discharge or bleeding, No axillary or supraclavicular adenopathy, positive findings: right breast mass at 12 o'clock soft, non tender at 2 fb from aerola, 1 cm Heart: regular rate and rhythm Abdomen: soft, non-tender; no masses,  no organomegaly Extremities: extremities normal, atraumatic, no cyanosis or edema Skin: Skin color, texture, turgor normal. No  rashes or lesions Lymph nodes: Cervical, supraclavicular, and axillary nodes normal. No abnormal inguinal nodes palpated Neurologic: Grossly normal   Pelvic: External genitalia:  no lesions              Urethra:  normal appearing urethra with no masses, tenderness or lesions              Bartholin's and Skene's: normal                 Vagina: normal appearing vagina with normal color and creamy white non odorous  discharge, no lesions affirm taken              Cervix: anteverted, multiparous appearance, no cervical motion tenderness and no lesions              Pap taken: Yes.   Bimanual Exam:  Uterus:  normal size, contour, position, consistency, mobility, non-tender              Adnexa: normal adnexa and no mass, fullness, tenderness               Rectovaginal: Confirms               Anus:  normal appearance, declined rectal exam  Chaperone present: yes  A:  Well Woman with normal exam  Contraception Nexplanon due for removal in 2018  Right breast mass  R/O vaginal infection  Screening labs  Family history of breast cancer mother age 29(died from breast cancer)  Hypertension/anxiety management with PCP    P:   Reviewed health and wellness pertinent to exam  Discussed warning signs with Nexplanon and need to advise.  Discussed finding and need for mammogram and [place] evaluation. Patient agreeable. Will be scheduled prior to leaving today.  Labs: Affirm, GC,Chlamydia, HIV,RPR  Continue PCP care as indicated  Pap smear as above with HPVHR   counseled on breast self exam, STD prevention, HIV risk factors and prevention, adequate intake of calcium and vitamin D, diet and exercise  return annually or prn  An After Visit Summary was printed and given to the patient.

## 2017-01-06 LAB — RPR

## 2017-01-06 LAB — HIV ANTIBODY (ROUTINE TESTING W REFLEX): HIV 1&2 Ab, 4th Generation: NONREACTIVE

## 2017-01-06 LAB — WET PREP BY MOLECULAR PROBE
CANDIDA SPECIES: NEGATIVE
Gardnerella vaginalis: POSITIVE — AB
TRICHOMONAS VAG: NEGATIVE

## 2017-01-07 ENCOUNTER — Other Ambulatory Visit: Payer: Self-pay | Admitting: Certified Nurse Midwife

## 2017-01-07 NOTE — Progress Notes (Signed)
Encounter reviewed Jill Jertson, MD   

## 2017-01-08 ENCOUNTER — Telehealth: Payer: Self-pay | Admitting: *Deleted

## 2017-01-08 MED ORDER — CLINDAMYCIN PHOSPHATE 2 % VA CREA: 1.0000 | TOPICAL_CREAM | Freq: Every day | VAGINAL | 0 refills | Status: AC

## 2017-01-08 NOTE — Telephone Encounter (Signed)
Left message to call back  

## 2017-01-08 NOTE — Telephone Encounter (Signed)
-----   Message from Regina Eck, CNM sent at 01/07/2017  7:49 PM EST ----- Notify patient that RPR, HIV are negative Wet prep positive for BV, negative for yeast and trichomonas Needs Rx Cleocin cream one applicator per vagina every hs x 5 no refills order not placed GC,Chlamydia pending

## 2017-01-08 NOTE — Telephone Encounter (Signed)
Patient is returning a call to Reina. °

## 2017-01-08 NOTE — Telephone Encounter (Signed)
Patient notified. Verbalized understanding. Rx sent to pharmacy.

## 2017-01-10 ENCOUNTER — Telehealth: Payer: Self-pay | Admitting: Certified Nurse Midwife

## 2017-01-10 DIAGNOSIS — Z803 Family history of malignant neoplasm of breast: Secondary | ICD-10-CM

## 2017-01-10 LAB — IPS N GONORRHOEA AND CHLAMYDIA BY PCR

## 2017-01-10 NOTE — Telephone Encounter (Signed)
Report from [facility] to Melvia Heaps, CNM for review.

## 2017-01-10 NOTE — Telephone Encounter (Signed)
Patient called to check on the status of the referral to remove a lump in her right breast.

## 2017-01-10 NOTE — Telephone Encounter (Signed)
Per [place] and mammogram there is only fibroglandular tissue noted, no mass. I will be glad for her to see a surgeon for second opinion.

## 2017-01-10 NOTE — Telephone Encounter (Signed)
Spoke with patient. Patient states her mothers BRCA was not detected by MMG or ultasound. Patient states she wanted to be sure she does not have BRCA. Patient states she was seeing Dr. Micah Noel in [facility] previously when she had a lump in her right breast and was advised to have the lump removed. Patient states she has spoken with her father and they would like to remove the "lump". Advised patient we have received a copy of MMG report, will allow Melvia Heaps, CNM to review and RN will update Melvia Heaps, CNM with concerns. Advised patient will return call with recommendations, patient is agreeable.  Melvia Heaps, CNM -please advise?

## 2017-01-10 NOTE — Telephone Encounter (Signed)
Left message to call Mohamud Mrozek at [phone].  

## 2017-01-11 LAB — IPS PAP TEST WITH HPV

## 2017-01-11 NOTE — Telephone Encounter (Signed)
Spoke with patient, advised as seen below per Melvia Heaps, CNM. Patient states she has seen Dr. Christian Mate at [place] and would like referral there for second opinion. Advised patient order placed for referral, our referral department will be in contact for scheduling. Patient verbalizes understanding and is agreeable.  Routing to provider for final review. Patient is agreeable to disposition. Will close encounter.  [facility] Dr. Ronny Bacon [address] [place], [place] [phone]   Cc: Theresia Lo

## 2017-01-11 NOTE — Addendum Note (Signed)
Addended by: Regina Eck on: 01/11/2017 03:10 PM   Modules accepted: Orders

## 2017-01-15 LAB — IPS HPV GENOTYPING 16/18

## 2017-01-17 ENCOUNTER — Telehealth: Payer: Self-pay | Admitting: *Deleted

## 2017-01-17 NOTE — Telephone Encounter (Signed)
Left message to call Sharee Pimple at [phone].   Notes Recorded by Regina Eck, CNM on 01/16/2017 at 11:47 AM EST Notify patient that pap smear negative, but HPVHR detected. Genotype 16,18 not detected. No further evaluation at this time. Repeat pap one year 08 Bv noted already addressed

## 2017-01-17 NOTE — Telephone Encounter (Signed)
Spoke with patient, advised of results and recommendations as seen below per Deborah Leonard, CNM. Patient verbalizes understanding and is agreeable.  Routing to provider for final review. Patient is agreeable to disposition. Will close encounter.  

## 2017-01-18 DIAGNOSIS — N644 Mastodynia: Secondary | ICD-10-CM | POA: Insufficient documentation

## 2017-01-22 ENCOUNTER — Telehealth: Payer: Self-pay | Admitting: *Deleted

## 2017-01-22 NOTE — Telephone Encounter (Signed)
01/22/17 Patient called today and ask can she have a refill on her pain medication, she is out.

## 2017-01-30 ENCOUNTER — Telehealth: Payer: Self-pay | Admitting: *Deleted

## 2017-01-30 ENCOUNTER — Other Ambulatory Visit: Payer: Self-pay | Admitting: Podiatry

## 2017-01-30 MED ORDER — OXYCODONE-ACETAMINOPHEN 10-325 MG PO TABS: 1.0000 | ORAL_TABLET | Freq: Two times a day (BID) | ORAL | 0 refills | Status: DC | PRN

## 2017-01-30 NOTE — Telephone Encounter (Signed)
01/30/17 Dr.Sheard Pt called this am and requesting a rx for pain, she called a couple weeks ago and we told her to call at the end of the month

## 2017-02-02 ENCOUNTER — Telehealth: Payer: Self-pay | Admitting: *Deleted

## 2017-02-02 NOTE — Telephone Encounter (Signed)
Agree with plan 

## 2017-02-02 NOTE — Telephone Encounter (Signed)
-----   Message from Regina Eck, CNM sent at 02/02/2017  7:46 AM EST ----- Regarding: breast referral I have not seen any response from the referral for breast evaluation for this patient. Can you check on. Thank you Debbi

## 2017-02-02 NOTE — Telephone Encounter (Signed)
Spoke with patient. Patient states she was seen by Dr. Christian Mate, recommendations were to return in 3 months for re-evaluation. Patient states she thinks she has BV again, reports white discharge with odor, recommended OV for further evaluation. Patient declined OV for today. Patient scheduled for 3/6 at 12:45pm with Lindsey Ruiz, CNM. Advised patient would review with Lindsey Ruiz, CNM and return call with any additional recommendations. Patient is agreeable.  Lindsey Ruiz, CNM -please review.

## 2017-02-06 ENCOUNTER — Ambulatory Visit (INDEPENDENT_AMBULATORY_CARE_PROVIDER_SITE_OTHER): Payer: Commercial Managed Care - HMO | Admitting: Certified Nurse Midwife

## 2017-02-06 ENCOUNTER — Encounter: Payer: Self-pay | Admitting: Certified Nurse Midwife

## 2017-02-06 VITALS — BP 122/80 | HR 78 | Resp 16 | Ht 68.0 in | Wt 182.4 lb

## 2017-02-06 DIAGNOSIS — B9689 Other specified bacterial agents as the cause of diseases classified elsewhere: Secondary | ICD-10-CM | POA: Diagnosis not present

## 2017-02-06 DIAGNOSIS — Z8742 Personal history of other diseases of the female genital tract: Secondary | ICD-10-CM

## 2017-02-06 DIAGNOSIS — N760 Acute vaginitis: Secondary | ICD-10-CM | POA: Diagnosis not present

## 2017-02-06 NOTE — Progress Notes (Deleted)
35 y.o. Single African American female [id number] here with complaint of vaginal symptoms of change and  increase discharge. Describes discharge as whitish, possible green tint, thick (sometimes). Did have an odor. Took probiotic and odor went away. Denies itching or burning. Onset of symptoms 6-7 days ago. Denies new personal products or vaginal dryness. Yes STD concerns, would like to have Gc/Chlamydia screening only.. Urinary symptoms no. Denies burning or irritation.  Contraception is Nexplanon( due for removal 09/14/18).  ROS Pertinent to HPI  O:Healthy female WDWN Affect: normal, orientation x 3  Exam: skin warm and dry Abdomen: soft, non tender  inguinal Lymph nodes: no enlargement or tenderness Pelvic exam: External genital: normal female, no lesions or exudate or scaling BUS: negative Vagina: white non odorous discharge noted. Ph:4.5   ,Wet prep taken, Cervix: normal, non tender, no CMT Uterus: normal, non tender Adnexa:normal, non tender, no masses or fullness noted   Wet Prep results: Koh, Saline  negative   A:Normal pelvic exam R/O GC,Chlamydia   P:Discussed findings of normal vaginal flora.. Discussed Aveeno or baking soda sitz bath for comfort if needed when odor occurs. Continue probiotic daily. Continue condom use for STD prevention. Questions addressed.   Rv prn 

## 2017-02-07 LAB — GC/CHLAMYDIA PROBE AMP
CT PROBE, AMP APTIMA: NOT DETECTED
GC Probe RNA: NOT DETECTED

## 2017-02-08 NOTE — Progress Notes (Signed)
Encounter reviewed Cristyn Crossno, MD   

## 2017-02-09 NOTE — Progress Notes (Signed)
36 y.o. Single African American female [phone] here with complaint of vaginal symptoms of change and  increase discharge. Describes discharge as whitish, possible green tint, thick (sometimes). Did have an odor. Took probiotic and odor went away. Denies itching or burning. Onset of symptoms 6-7 days ago. Denies new personal products or vaginal dryness. Yes STD concerns, would like to have Gc/Chlamydia screening only.. Urinary symptoms no. Denies burning or irritation.  Contraception is Nexplanon( due for removal 09/14/18).  ROS Pertinent to HPI  O:Healthy female WDWN Affect: normal, orientation x 3  Exam: skin warm and dry Abdomen: soft, non tender  inguinal Lymph nodes: no enlargement or tenderness Pelvic exam: External genital: normal female, no lesions or exudate or scaling BUS: negative Vagina: white non odorous discharge noted. Ph:4.5   ,Wet prep taken, Cervix: normal, non tender, no CMT Uterus: normal, non tender Adnexa:normal, non tender, no masses or fullness noted   Wet Prep results: Koh, Saline  negative   A:Normal pelvic exam R/O GC,Chlamydia   P:Discussed findings of normal vaginal flora.. Discussed Aveeno or baking soda sitz bath for comfort if needed when odor occurs. Continue probiotic daily. Continue condom use for STD prevention. Questions addressed.   Rv prn

## 2017-02-22 ENCOUNTER — Telehealth: Payer: Self-pay | Admitting: Certified Nurse Midwife

## 2017-02-22 NOTE — Telephone Encounter (Signed)
Spoke with patient, advised as seen below per Deborah Leonard, CNM. Patient verbalizes understanding and is agreeable.   Routing to provider for final review. Patient is agreeable to disposition. Will close encounter.  

## 2017-02-22 NOTE — Telephone Encounter (Signed)
Odor can occur after menses due to Ph Change. She can do baking soda tub bath, no concerns and this should resolve this, with no other symptoms.

## 2017-02-22 NOTE — Telephone Encounter (Signed)
Patient says her bacterial vaginosis has gotten worse with her period.

## 2017-02-22 NOTE — Telephone Encounter (Signed)
Spoke with patient. Patient states she was in to see Mrs. Debbie on 3/6 and had BV. Patient states she was advised to continue probiotic and call if worsens or continues. Patient reports she is taking probiotic daily. LMP 3/14 or 3/15, patient unsure of which day. Patient reports vaginal odor started when cycle stopped along with a thin pinkish discharge. Patient denies urinary complaints. Patient states she has not soaked in baking soda bath d/t menses, has tried previously. Patient states she has to go to work at [employer] patient would review with Melvia Heaps, CNM and return call with recommendations. Advised patient Melvia Heaps, CNM is seeing patients, response may not be immediate. Patient is agreeable.  Melvia Heaps, CNM -please advise?

## 2017-02-27 ENCOUNTER — Encounter: Payer: Self-pay | Admitting: Podiatry

## 2017-02-27 ENCOUNTER — Ambulatory Visit (INDEPENDENT_AMBULATORY_CARE_PROVIDER_SITE_OTHER): Payer: 59 | Admitting: Podiatry

## 2017-02-27 DIAGNOSIS — M79672 Pain in left foot: Secondary | ICD-10-CM | POA: Diagnosis not present

## 2017-02-27 DIAGNOSIS — M65872 Other synovitis and tenosynovitis, left ankle and foot: Secondary | ICD-10-CM

## 2017-02-27 DIAGNOSIS — M659 Synovitis and tenosynovitis, unspecified: Secondary | ICD-10-CM

## 2017-02-27 MED ORDER — OXYCODONE-ACETAMINOPHEN 10-325 MG PO TABS: 1.0000 | ORAL_TABLET | Freq: Two times a day (BID) | ORAL | 0 refills | Status: DC | PRN

## 2017-02-27 MED ORDER — DICLOFENAC EPOLAMINE 1.3 % TD PTCH: 1.0000 | MEDICATED_PATCH | Freq: Two times a day (BID) | TRANSDERMAL | 1 refills | Status: DC

## 2017-02-27 NOTE — Progress Notes (Signed)
  Subjective: Status post Cotton osteotomy left foot (06/02/16). Patient presents complaining of pain on top of old incision over right dorsum. Hurts all the time.  Stated that she is still working as many hours as possible at [employer]. 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   HPI: Patient is working 2 jobs. She dropped a pallet of soap box on left foot. Now having pain at just above the anterior ankle joint and anterior inferior to lateral malleoli area near CCJ of left. Incident happened in 11/17/16. She is able to walk and been back to work at [employer]. She is able to take breaks every 2 hours according to restriction issued 2 weeks ago.  She sits down at her other full time job.   Objective: No new changes. Post op wound healing complete with maintained correction in both feet.  Post op pain on left, unable to manage work more than 6 hours. No edema or erythema noted.  Assessment: Normal post op recovery with residual foot pain on left. Tenosynovitis left with constant foot pain near surgery site left foot.  Pain with prolonged weight bearing.   Plan: Advised to wear lace up tennis shoes and increase activity as tolerated.  Advised to decrease work hour that requiring to be on feet. Will try Flector patch If available.  Pain medication prescribed as per request.  Return as needed.

## 2017-02-27 NOTE — Patient Instructions (Addendum)
Pain in left foot around surgical site. Possible arthritic pain due to weak surrounding joint.  Stay in well supported tennis shoes and orthotics.  Reduce work hour that requiring to be on feet. May benefit from cortisone injection if needed. Antiinflammatory patch prescribed. May take pain medication if needed. Return as needed.

## 2017-03-01 ENCOUNTER — Encounter: Payer: Self-pay | Admitting: Certified Nurse Midwife

## 2017-03-14 ENCOUNTER — Telehealth: Payer: Self-pay | Admitting: Certified Nurse Midwife

## 2017-03-14 MED ORDER — METRONIDAZOLE 0.75 % VA GEL: VAGINAL | 0 refills | Status: DC

## 2017-03-14 NOTE — Telephone Encounter (Signed)
She has history of chronic BV, Ok to do Metrogel this time, if symptoms do not clear needs OV.Marland Kitchen

## 2017-03-14 NOTE — Telephone Encounter (Signed)
Spoke with patient, advised as seen below per Melvia Heaps, CNM. Rx for metrogel to verified pharmacy on file. Advised to avoid alcohol during treatment and 24 hours after completing medication. Don't mix with alcohol if mixed can cause severe nausea, vomiting and abdominal cramping. Normal side effects of Flagyl may include metallic taste in mouth, slight nausea, headache, abdominal cramping, diarrhea and dizziness. Patient states she is due to start cycle and does not want oral flagyl, when should she start metrogel? Advised patient to start metrogel after cycle has stopped. Patient verbalizes understanding and is agreeable.  Routing to provider for final review. Patient is agreeable to disposition. Will close encounter.

## 2017-03-14 NOTE — Telephone Encounter (Signed)
Patient called and said her symptoms have not improved since her last visit, 02/06/17. She also said Melvia Heaps, CNM told her to call in to get something else prescribed if this happened.  Pharmacy on file confirmed.

## 2017-03-14 NOTE — Telephone Encounter (Signed)
Spoke with patient. Patient states vaginal symptoms have not resolved. Patient reports white vaginal discharge with odor. Patient denies itching, dryness, pain or urinary complaints. Patient states cycle d/t start any day now. Patient reports that she has taken baking soda baths with no change, stopped probiotic, wasn't working. Recommended OV for further evlaluation, patient declined. Patient states she  Was told at 3/6 OV she could call for additional medication if symptoms not clearing. Reviewed provider OV notes and telephone encounter dated 3/6 and 3/22 -recommended OV for further evaluation. Patient declined, asked that I as Melvia Heaps, CNM if medication can be sent in. Advised patient would review with Melvia Heaps, CNM and return call with recommendations.  Melvia Heaps, CNM-please advise?

## 2017-03-27 ENCOUNTER — Other Ambulatory Visit: Payer: Self-pay | Admitting: Podiatry

## 2017-03-27 ENCOUNTER — Telehealth: Payer: Self-pay | Admitting: *Deleted

## 2017-03-27 MED ORDER — DICLOFENAC EPOLAMINE 1.3 % TD PTCH
1.0000 | MEDICATED_PATCH | Freq: Two times a day (BID) | TRANSDERMAL | 1 refills | Status: DC
Start: 2017-03-27 — End: 2017-05-17

## 2017-03-27 MED ORDER — OXYCODONE-ACETAMINOPHEN 10-325 MG PO TABS: 1.0000 | ORAL_TABLET | Freq: Two times a day (BID) | ORAL | 0 refills | Status: DC | PRN

## 2017-03-27 NOTE — Telephone Encounter (Signed)
03/27/17 Patient ask can you refill her pain rx and rx for patches also.

## 2017-04-26 ENCOUNTER — Other Ambulatory Visit: Payer: Self-pay | Admitting: Podiatry

## 2017-04-26 MED ORDER — OXYCODONE-ACETAMINOPHEN 10-325 MG PO TABS: 1.0000 | ORAL_TABLET | Freq: Two times a day (BID) | ORAL | 0 refills | Status: DC | PRN

## 2017-05-17 ENCOUNTER — Encounter: Payer: Self-pay | Admitting: Certified Nurse Midwife

## 2017-05-17 ENCOUNTER — Ambulatory Visit (INDEPENDENT_AMBULATORY_CARE_PROVIDER_SITE_OTHER): Payer: Commercial Managed Care - HMO | Admitting: Certified Nurse Midwife

## 2017-05-17 VITALS — BP 124/80 | HR 70 | Resp 16 | Ht 68.0 in | Wt 178.0 lb

## 2017-05-17 DIAGNOSIS — N760 Acute vaginitis: Secondary | ICD-10-CM

## 2017-05-17 DIAGNOSIS — R8290 Unspecified abnormal findings in urine: Secondary | ICD-10-CM

## 2017-05-17 DIAGNOSIS — B9689 Other specified bacterial agents as the cause of diseases classified elsewhere: Secondary | ICD-10-CM | POA: Diagnosis not present

## 2017-05-17 LAB — POCT URINALYSIS DIPSTICK
Bilirubin, UA: NEGATIVE
GLUCOSE UA: NEGATIVE
Ketones, UA: NEGATIVE
Leukocytes, UA: NEGATIVE
Nitrite, UA: NEGATIVE
PROTEIN UA: NEGATIVE
UROBILINOGEN UA: NEGATIVE U/dL — AB
pH, UA: 5 (ref 5.0–8.0)

## 2017-05-17 MED ORDER — CLINDAMYCIN PHOSPHATE 2 % VA CREA: TOPICAL_CREAM | VAGINAL | 0 refills | Status: DC

## 2017-05-17 MED ORDER — METRONIDAZOLE 0.75 % VA GEL: 1.0000 | Freq: Two times a day (BID) | VAGINAL | 0 refills | Status: DC

## 2017-05-17 NOTE — Progress Notes (Signed)
36 y.o. Single African American female [phone] here with complaint of vaginal symptoms of,  increase discharge with odor. Describes discharge as odorous and clear. Had missed period for the past two months and had a period for about 2 weeks and feel this have may have caused this. Period has stopped now.Onset of symptoms 3-4 days ago. Denies new personal products.No STD concerns. Urinary symptoms no. . Contraception is Nexplanon.  ROS Pertinent for HPI.  O:Healthy female WDWN Affect: normal, orientation x 3 poct urine-rbc tr  Exam: Abdomen:soft, non  tender  Inguinal Lymph nodes: no enlargement or tenderness Pelvic exam: External genital: normal female, no redness or scaling or exudate BUS: negative Vagina: odorous yellow discharge noted. Ph:5.0   ,Wet prep taken,  Cervix: normal, non tender, no CMT Uterus: normal, non tender Adnexa:normal, non tender, no masses or fullness noted   Wet Prep results: + KOH,Saline for BV   A:Normal pelvic exam Chronic history of BV Contraception condoms   P:Discussed findings of BV and etiology. Discussed Aveeno or baking soda sitz bath for comfort. Prior to activity can be used to external skin for protection or dryness. Discussed Boric acid capsule trial to try to resolve chronic BV. Patient would like to try. Rx: Cleocin cream see order with instructions Rx Boric Acid 600mg  0 size capsules dispense 30# Insert one in vagina for 5 days when symptoms occur, advise if no change or problems with use.  Written Rx given to patient.  Rv prn

## 2017-05-17 NOTE — Patient Instructions (Signed)

## 2017-05-28 ENCOUNTER — Telehealth: Payer: Self-pay | Admitting: *Deleted

## 2017-05-28 MED ORDER — OXYCODONE-ACETAMINOPHEN 10-325 MG PO TABS: 1.0000 | ORAL_TABLET | Freq: Two times a day (BID) | ORAL | 0 refills | Status: DC | PRN

## 2017-05-28 NOTE — Telephone Encounter (Signed)
05/28/17 Patient ask can she get her RX for pain today. She has an appointment on Wednesday.

## 2017-05-30 ENCOUNTER — Ambulatory Visit (INDEPENDENT_AMBULATORY_CARE_PROVIDER_SITE_OTHER): Payer: 59 | Admitting: Podiatry

## 2017-05-30 ENCOUNTER — Encounter: Payer: Self-pay | Admitting: Podiatry

## 2017-05-30 DIAGNOSIS — M21969 Unspecified acquired deformity of unspecified lower leg: Secondary | ICD-10-CM | POA: Diagnosis not present

## 2017-05-30 DIAGNOSIS — M216X1 Other acquired deformities of right foot: Secondary | ICD-10-CM | POA: Diagnosis not present

## 2017-05-30 DIAGNOSIS — M79604 Pain in right leg: Secondary | ICD-10-CM | POA: Diagnosis not present

## 2017-05-30 NOTE — Patient Instructions (Signed)
Seen for pain and swelling in right lateral ankle. Noted of weak first metatarsal bone and excess compensatory pronation causing the ankle to tilt to side. Continue with custom orthotics with tennis shoes. May benefit from ankle brace. If pain continues my benefit from STJ arthrereisis procedure. Return as needed.

## 2017-05-30 NOTE — Progress Notes (Signed)
Subjective: 36 year old female presents complaining of pain and swelling on right lateral ankle area.  Patient wears regular tennis shoes with orthotics.  HPI: Status post Cotton osteotomy left foot (06/02/16), right foot (11/19/15)  Stated that she is still working as many hours as possible at [employer]. 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 She sits down at her other full time job.   Objective: Dermatologic: No abnormal findings. Vascular: All pedal pulses are palpable. No visible edema or erythema noted. Neurologic: All epicritic and tactile sensations grossly intact. Orthopedic: Positive of excess sagittal plane motion at the first MCJ upon loading of forefoot bilateral R>L. Post surgical first ray is maintaining its corrected position of plantar flexion bilateral in none weight bearing.    Assessment: Maintained post surgical plantar flexion of the first ray following Cotton osteotomy with bone graft bilateral. Excess sagittal plane motion of the first MCJ with weight bearing. 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  Plan: Reviewed clinical findings and available treatment options. Advised to stay in Ankle brace with lace up tennis shoes to limit excess pronation of the right STJ.  May benefit from STJ arthroereisis of right foot to limit excess eversion and abduction of STJ upon weight bearing. Patient understands that if this procedure fails to improve the condition, the next possible procedure wound be fusion of the first MCJ to limit the excess motion. Patient will return for pre op if ankle brace fails to alleviate pain.

## 2017-06-12 ENCOUNTER — Ambulatory Visit: Payer: 59 | Admitting: Podiatry

## 2017-06-19 ENCOUNTER — Encounter: Payer: Self-pay | Admitting: Podiatry

## 2017-06-19 ENCOUNTER — Ambulatory Visit (INDEPENDENT_AMBULATORY_CARE_PROVIDER_SITE_OTHER): Payer: 59 | Admitting: Podiatry

## 2017-06-19 DIAGNOSIS — M21969 Unspecified acquired deformity of unspecified lower leg: Secondary | ICD-10-CM | POA: Diagnosis not present

## 2017-06-19 DIAGNOSIS — M216X1 Other acquired deformities of right foot: Secondary | ICD-10-CM | POA: Diagnosis not present

## 2017-06-19 DIAGNOSIS — M79604 Pain in right leg: Secondary | ICD-10-CM

## 2017-06-19 MED ORDER — OXYCODONE-ACETAMINOPHEN 10-325 MG PO TABS: 1.0000 | ORAL_TABLET | Freq: Two times a day (BID) | ORAL | 0 refills | Status: DC | PRN

## 2017-06-19 NOTE — Progress Notes (Signed)
Subjective: 36 year old female presents for follow up on pain and swelling on right lateral ankle area.  Been wearing ankle brace that has provided some relief. Patient is requesting surgical intervention to reduce more pain.   HPI: Status post Cotton osteotomy left foot (06/02/16), right foot (11/19/15)  Stated that she is still working as many hours as possible at [employer]. 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 She sits down at her other full time job.   Objective: Dermatologic: No abnormal findings. Vascular: All pedal pulses are palpable. No visible edema or erythema noted. Neurologic: All epicritic and tactile sensations grossly intact. Orthopedic: Positive of excess sagittal plane motion at the first MCJ upon loading of forefoot bilateral R>L. Post surgical first ray is maintaining its corrected position of plantar flexion bilateral in none weight bearing.    Assessment: Maintained post surgical plantar flexion of the first ray following Cotton osteotomy with bone graft bilateral. Excess sagittal plane motion of the first MCJ with weight bearing. 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  Plan: Reviewed clinical findings and available treatment options. As per request surgery consent form reviewed for STJ arthroereisis right foot. Patient understands that if this procedure fails to improve the condition, the next possible procedure wound be fusion of the first MCJ to limit the excess motion. Surgical procedure brochure dispensed. Continue with ankle brace as needed. Pain medication re ordered.

## 2017-06-19 NOTE — Patient Instructions (Signed)
Seen for pain in right foot with prolonged ambulation. Diagnosis was made as compensatory excess pronation of the Subtalar joint right foot with unstable first metatarsal bone. Surgery consent form reviewed for STJ arthroereisis right foot.  If pain continues after this procedure, it may require another procedure, fusion of the first Metatarsocuneiform joint on right foot. May continue use ankle brace meantime.

## 2017-07-05 ENCOUNTER — Other Ambulatory Visit: Payer: Self-pay | Admitting: Podiatry

## 2017-07-05 MED ORDER — NABUMETONE 500 MG PO TABS: 500.0000 mg | ORAL_TABLET | Freq: Two times a day (BID) | ORAL | 1 refills | Status: DC

## 2017-07-05 MED ORDER — OXYCODONE-ACETAMINOPHEN 10-325 MG PO TABS: 1.0000 | ORAL_TABLET | Freq: Four times a day (QID) | ORAL | 0 refills | Status: DC | PRN

## 2017-07-06 DIAGNOSIS — M25374 Other instability, right foot: Secondary | ICD-10-CM | POA: Diagnosis not present

## 2017-07-06 DIAGNOSIS — M79671 Pain in right foot: Secondary | ICD-10-CM | POA: Diagnosis not present

## 2017-07-06 HISTORY — PX: OTHER SURGICAL HISTORY: SHX169

## 2017-07-09 ENCOUNTER — Encounter: Payer: Self-pay | Admitting: *Deleted

## 2017-07-11 ENCOUNTER — Ambulatory Visit (INDEPENDENT_AMBULATORY_CARE_PROVIDER_SITE_OTHER): Payer: 59 | Admitting: Podiatry

## 2017-07-11 DIAGNOSIS — Z9889 Other specified postprocedural states: Secondary | ICD-10-CM

## 2017-07-12 ENCOUNTER — Encounter: Payer: Self-pay | Admitting: Podiatry

## 2017-07-12 MED ORDER — OXYCODONE-ACETAMINOPHEN 10-325 MG PO TABS: 1.0000 | ORAL_TABLET | Freq: Four times a day (QID) | ORAL | 0 refills | Status: DC | PRN

## 2017-07-12 NOTE — Patient Instructions (Signed)
1 week post op wound healing normal. Dressing changed. Ok to walk without crutches. Letter for work dispensed.

## 2017-07-12 NOTE — Progress Notes (Signed)
1 week post op visit following STJ arthroereisis right foot. Patient is walking with crutches. Having pain with weight bearing. Normal wound healing noted. Minimum edema noted at surgery site. Dressing change done. Gait training done. Ok to walk without crutches. Pain medication re prescribed. Letter for work dispensed. Return in one week

## 2017-07-19 ENCOUNTER — Encounter: Payer: Self-pay | Admitting: Podiatry

## 2017-07-19 ENCOUNTER — Ambulatory Visit (INDEPENDENT_AMBULATORY_CARE_PROVIDER_SITE_OTHER): Payer: 59 | Admitting: Podiatry

## 2017-07-19 DIAGNOSIS — Z9889 Other specified postprocedural states: Secondary | ICD-10-CM

## 2017-07-19 NOTE — Patient Instructions (Signed)
2 weeks post op wound healing normal. Ok to get wet. May use Ace wrap and tennis shoes as tolerated.  Ok to work 5-6 hours a day. Return in one week.

## 2017-07-19 NOTE — Progress Notes (Signed)
2 week post op visit following STJ arthroereisis right foot, 07/06/17. Patient is ambulating without difficulty. Having pain with weight bearing. Normal wound healing noted. Minimum edema noted at surgery site. Dressing change done. Letter for work dispensed. Ok to work 5-6 hours a day. Return in one week

## 2017-07-26 ENCOUNTER — Encounter: Payer: Self-pay | Admitting: Podiatry

## 2017-07-26 ENCOUNTER — Ambulatory Visit (INDEPENDENT_AMBULATORY_CARE_PROVIDER_SITE_OTHER): Payer: 59 | Admitting: Podiatry

## 2017-07-26 ENCOUNTER — Other Ambulatory Visit: Payer: Self-pay | Admitting: *Deleted

## 2017-07-26 DIAGNOSIS — M21969 Unspecified acquired deformity of unspecified lower leg: Secondary | ICD-10-CM

## 2017-07-26 DIAGNOSIS — M216X1 Other acquired deformities of right foot: Secondary | ICD-10-CM | POA: Diagnosis not present

## 2017-07-26 DIAGNOSIS — Z9889 Other specified postprocedural states: Secondary | ICD-10-CM

## 2017-07-26 MED ORDER — NONFORMULARY OR COMPOUNDED ITEM: 1 refills | Status: DC

## 2017-07-26 MED ORDER — OXYCODONE-ACETAMINOPHEN 10-325 MG PO TABS: 1.0000 | ORAL_TABLET | Freq: Four times a day (QID) | ORAL | 0 refills | Status: DC | PRN

## 2017-07-26 NOTE — Progress Notes (Signed)
3 weeks post op visit following STJ arthroereisis right foot, 07/06/17. Patient is ambulating without difficulty. Having pain with weight bearing. Normal wound healing noted. Minimum edema noted at surgery site. Sutures removed. Placed in Ace wrap. Ok to wear regular tennis shoes as tolerated. As per request, work hours reduced 4 hours a day. Return in one week

## 2017-07-26 NOTE — Telephone Encounter (Signed)
Medication refill request: Boric Acid supp Last AEX:  01/05/17  Next AEX: ? Last MMG (if hormonal medication request): 01/10/17  Refill authorized: 05/17/17 #[id number]. Today please advise.

## 2017-07-26 NOTE — Patient Instructions (Signed)
3 weeks post op wound healing normal. Sutures removed. May start wearing tennis shoe. Return in 3 weeks.

## 2017-07-27 NOTE — Telephone Encounter (Signed)
Rx faxed today 

## 2017-08-07 ENCOUNTER — Telehealth: Payer: Self-pay | Admitting: *Deleted

## 2017-08-07 MED ORDER — OXYCODONE-ACETAMINOPHEN 10-325 MG PO TABS: 1.0000 | ORAL_TABLET | Freq: Four times a day (QID) | ORAL | 0 refills | Status: DC | PRN

## 2017-08-07 NOTE — Telephone Encounter (Signed)
08/06/17 Patient called this am and request a refill on her pain medication.

## 2017-08-14 ENCOUNTER — Encounter: Payer: Self-pay | Admitting: Podiatry

## 2017-08-16 ENCOUNTER — Encounter: Payer: 59 | Admitting: Podiatry

## 2017-08-22 ENCOUNTER — Ambulatory Visit (INDEPENDENT_AMBULATORY_CARE_PROVIDER_SITE_OTHER): Payer: 59 | Admitting: Podiatry

## 2017-08-22 ENCOUNTER — Encounter: Payer: Self-pay | Admitting: Podiatry

## 2017-08-22 DIAGNOSIS — Z9889 Other specified postprocedural states: Secondary | ICD-10-CM

## 2017-08-22 MED ORDER — OXYCODONE-ACETAMINOPHEN 10-325 MG PO TABS: 1.0000 | ORAL_TABLET | Freq: Four times a day (QID) | ORAL | 0 refills | Status: DC | PRN

## 2017-08-22 NOTE — Progress Notes (Signed)
5 weeks post op visit following STJ arthroereisis right foot, 07/06/17. Patient is ambulating without difficulty. Having pain with weight bearing. Normal wound healing noted.  Increase weight bearing with regular tennis shoes as tolerated. As per request, work hours increased to 6 hours a day. Pain medication prescribed. Return in one month.

## 2017-08-22 NOTE — Patient Instructions (Signed)
5 weeks post op wound healing normal. May increase work hours to 6 or more per day as tolerated. Return in one month.

## 2017-09-10 ENCOUNTER — Telehealth: Payer: Self-pay | Admitting: *Deleted

## 2017-09-10 NOTE — Telephone Encounter (Signed)
09/10/17 Patient called this afternoon and requested a refill on her pain medication and also needs a note saying she can wear tennis shoes at work.

## 2017-09-13 ENCOUNTER — Other Ambulatory Visit: Payer: Self-pay | Admitting: Podiatry

## 2017-09-13 ENCOUNTER — Encounter: Payer: Self-pay | Admitting: Podiatry

## 2017-09-13 MED ORDER — OXYCODONE-ACETAMINOPHEN 10-325 MG PO TABS: 1.0000 | ORAL_TABLET | Freq: Four times a day (QID) | ORAL | 0 refills | Status: DC | PRN

## 2017-09-20 ENCOUNTER — Encounter: Payer: Self-pay | Admitting: Podiatry

## 2017-09-20 ENCOUNTER — Ambulatory Visit (INDEPENDENT_AMBULATORY_CARE_PROVIDER_SITE_OTHER): Payer: 59 | Admitting: Podiatry

## 2017-09-20 DIAGNOSIS — Z9889 Other specified postprocedural states: Secondary | ICD-10-CM

## 2017-09-20 NOTE — Patient Instructions (Signed)
11 weeks post op wound healing normal right foot. May increase regular activity as tolerated. Return as needed.

## 2017-09-20 NOTE — Progress Notes (Signed)
Status post 11 weeks STJ arthroereisis right foot, 07/06/17.  Patient is ambulating without difficulty. Reduced pronation and increase first ray stability noted on right foot. May increase activity as tolerated. Return as needed.

## 2017-10-02 ENCOUNTER — Ambulatory Visit (INDEPENDENT_AMBULATORY_CARE_PROVIDER_SITE_OTHER): Payer: 59 | Admitting: Podiatry

## 2017-10-02 ENCOUNTER — Encounter: Payer: Self-pay | Admitting: Podiatry

## 2017-10-02 DIAGNOSIS — Z9889 Other specified postprocedural states: Secondary | ICD-10-CM

## 2017-10-02 MED ORDER — OXYCODONE-ACETAMINOPHEN 10-325 MG PO TABS: 1.0000 | ORAL_TABLET | Freq: Three times a day (TID) | ORAL | 0 refills | Status: DC | PRN

## 2017-10-02 NOTE — Progress Notes (Signed)
12 and a half weekspost op visit following STJ arthroereisis right foot, 07/06/17.  Stated that she went to work at [employer] and [employer], which requires to be on her feet. Experienced too much pain after been back to work. Much pain is in on right dorsolateral and heel of right foot. Using orthotics instead of ankle brace because it was difficult to keep in the shoe. Patient wishes to reduce her work hour that requires being on feet. Right foot has normal STJ movement with good plantar flexion of the first ray. Normal wound healing with residual post op pain after prolonged weight bearing. Pain medication reordered.

## 2017-10-02 NOTE — Patient Instructions (Signed)
Seen for post op pain on right foot. Possible being on feet too long. May take more frequent break at work and may reduce work hour. Pain medication prescribed. Continue with current level of activity if tolerated.

## 2017-10-23 ENCOUNTER — Telehealth: Payer: Self-pay | Admitting: Certified Nurse Midwife

## 2017-10-23 ENCOUNTER — Ambulatory Visit: Payer: Self-pay | Admitting: Certified Nurse Midwife

## 2017-10-23 NOTE — Telephone Encounter (Signed)
Spoke with patient. Reports left breast lump, describes as bruised. Noticed last night. Denies redness, tenderness or d/c. Patient states she has hx of right breast lumpectomy.   Recommended OV for further evaluation, patient declined OV offered for 11/21 at 4pm, request earlier appt. Advised patient would need to review schedule with Melvia Heaps, CNM and return call. Patient is agreeable.

## 2017-10-23 NOTE — Telephone Encounter (Signed)
[place] office staff contacted patient, did not show for 1pm appointment. Patient returned call, states she was going to be worked into schedule. Advised patient as previously discussed, scheduled for 1pm today with Melvia Heaps, CNM, will be worked into schedule. Patient request to reschedule.   OV rescheduled to 11/21 at 4pm with Melvia Heaps, CNM. Advised patient to arrive at 3:45pm. Patient verbalizes understanding and is agreeable.   Routing to provider for final review. Patient is agreeable to disposition. Will close encounter.

## 2017-10-23 NOTE — Telephone Encounter (Signed)
Patient found a lump in her left breast.  Patient states no redness or tenderness but has a bruised look. LAEX-01/05/2017

## 2017-10-23 NOTE — Telephone Encounter (Signed)
Reviewed schedule with Melvia Heaps, CNM, may work into afternoon schedule.   Spoke with patient, scheduled for toda at 1pm with Melvia Heaps, CNM. Advised patient will be worked into schedule. Patient verbalizes understanding and is agreeable.   Routing to provider for final review. Patient is agreeable to disposition. Will close encounter.

## 2017-10-24 ENCOUNTER — Ambulatory Visit: Payer: 59 | Admitting: Certified Nurse Midwife

## 2017-10-24 ENCOUNTER — Encounter: Payer: Self-pay | Admitting: Certified Nurse Midwife

## 2017-10-29 ENCOUNTER — Telehealth: Payer: Self-pay | Admitting: *Deleted

## 2017-10-29 NOTE — Telephone Encounter (Signed)
10/29/17 Patient called this afternoon and she needs a RX for foot pain.

## 2017-10-30 MED ORDER — OXYCODONE-ACETAMINOPHEN 10-325 MG PO TABS: 1.0000 | ORAL_TABLET | Freq: Three times a day (TID) | ORAL | 0 refills | Status: DC | PRN

## 2017-11-20 DIAGNOSIS — N63 Unspecified lump in unspecified breast: Secondary | ICD-10-CM | POA: Insufficient documentation

## 2017-11-20 DIAGNOSIS — Z803 Family history of malignant neoplasm of breast: Secondary | ICD-10-CM | POA: Insufficient documentation

## 2017-11-22 HISTORY — PX: BREAST LUMPECTOMY: SHX2

## 2017-11-28 ENCOUNTER — Telehealth: Payer: Self-pay | Admitting: *Deleted

## 2017-11-28 MED ORDER — OXYCODONE-ACETAMINOPHEN 10-325 MG PO TABS: 1.0000 | ORAL_TABLET | Freq: Two times a day (BID) | ORAL | 0 refills | Status: DC | PRN

## 2017-11-28 NOTE — Telephone Encounter (Signed)
11/28/2017 Patient called this afternoon and requested a refill on her rx for pain.

## 2017-11-30 ENCOUNTER — Other Ambulatory Visit: Payer: Self-pay

## 2017-11-30 ENCOUNTER — Encounter (HOSPITAL_BASED_OUTPATIENT_CLINIC_OR_DEPARTMENT_OTHER): Payer: Self-pay

## 2017-11-30 ENCOUNTER — Emergency Department (HOSPITAL_BASED_OUTPATIENT_CLINIC_OR_DEPARTMENT_OTHER)
Admission: EM | Admit: 2017-11-30 | Discharge: 2017-11-30 | Discharge status: Against Medical Advice | Payer: 59 | Attending: Emergency Medicine | Admitting: Emergency Medicine

## 2017-11-30 ENCOUNTER — Emergency Department (HOSPITAL_BASED_OUTPATIENT_CLINIC_OR_DEPARTMENT_OTHER): Payer: 59

## 2017-11-30 DIAGNOSIS — M79671 Pain in right foot: Secondary | ICD-10-CM | POA: Insufficient documentation

## 2017-11-30 DIAGNOSIS — Z5321 Procedure and treatment not carried out due to patient leaving prior to being seen by health care provider: Secondary | ICD-10-CM | POA: Diagnosis not present
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
## 2017-11-30 NOTE — ED Notes (Signed)
Pt given large ace wrap to help with supporting her injured breast as she left the house without putting a bra on

## 2017-11-30 NOTE — ED Notes (Signed)
Pt at check out states she is leaving d/t wait time; pt in no distress, ambulatory with no difficulties

## 2017-12-03 ENCOUNTER — Ambulatory Visit (INDEPENDENT_AMBULATORY_CARE_PROVIDER_SITE_OTHER): Payer: 59 | Admitting: Podiatry

## 2017-12-03 ENCOUNTER — Encounter: Payer: Self-pay | Admitting: Podiatry

## 2017-12-03 DIAGNOSIS — S99921A Unspecified injury of right foot, initial encounter: Secondary | ICD-10-CM

## 2017-12-03 DIAGNOSIS — M79604 Pain in right leg: Secondary | ICD-10-CM

## 2017-12-03 NOTE — Patient Instructions (Signed)
Seen for injured right foot. X-ray show no broken bones. Placed in CAM walker. Stay in CAM walker till pain and swelling subside. Return as needed.
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
## 2017-12-11 ENCOUNTER — Ambulatory Visit: Payer: 59 | Admitting: Podiatry

## 2017-12-18 ENCOUNTER — Ambulatory Visit: Payer: 59 | Admitting: Certified Nurse Midwife

## 2017-12-19 ENCOUNTER — Ambulatory Visit (INDEPENDENT_AMBULATORY_CARE_PROVIDER_SITE_OTHER): Payer: 59 | Admitting: Certified Nurse Midwife

## 2017-12-19 ENCOUNTER — Encounter: Payer: Self-pay | Admitting: Certified Nurse Midwife

## 2017-12-19 VITALS — BP 140/90 | HR 70 | Resp 16 | Ht 68.0 in | Wt 177.0 lb

## 2017-12-19 DIAGNOSIS — N898 Other specified noninflammatory disorders of vagina: Secondary | ICD-10-CM

## 2017-12-19 DIAGNOSIS — E559 Vitamin D deficiency, unspecified: Secondary | ICD-10-CM | POA: Insufficient documentation

## 2017-12-19 DIAGNOSIS — E119 Type 2 diabetes mellitus without complications: Secondary | ICD-10-CM | POA: Insufficient documentation

## 2017-12-19 DIAGNOSIS — N920 Excessive and frequent menstruation with regular cycle: Secondary | ICD-10-CM | POA: Insufficient documentation

## 2017-12-19 DIAGNOSIS — Z1371 Encounter for nonprocreative screening for genetic disease carrier status: Secondary | ICD-10-CM | POA: Insufficient documentation

## 2017-12-19 DIAGNOSIS — Z3046 Encounter for surveillance of implantable subdermal contraceptive: Secondary | ICD-10-CM

## 2017-12-19 DIAGNOSIS — B009 Herpesviral infection, unspecified: Secondary | ICD-10-CM | POA: Insufficient documentation
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
## 2017-12-19 NOTE — Patient Instructions (Signed)
Pregnancy and Rubella Rubella is a viral infection that can be very harmful to a pregnant woman and her fetus. A rubella infection in the first trimester increases the risk of losing the fetus by miscarriage or stillbirth. It also increases the risk of premature delivery and the risk of the baby having severe birth defects, such as deafness, cataracts, congenital heart disease, or intellectual disabilities. Rubella has less effect on the fetus if the mother contracts the disease later in pregnancy, but it affects all stages of pregnancy. What are the causes? This condition is caused by a virus. The virus can spread from person to person (is contagious) through coughing or sneezing. Pregnant women can pass the virus to their fetus. What are the signs or symptoms? Symptoms of this condition include:  Fever.  Red rash is typically the first sign. It usually starts on the face and spreads to the body.  Headache.  Pink eye.  Sore throat, cough, runny nose.  Swollen lymph glands.  Pain and swelling of joints.  How is this diagnosed? This condition is diagnosed based on:  Your symptoms and a physical exam.  Blood tests to confirm the diagnosis.  How is this treated? There is no effective treatment for a pregnant woman who is infected with rubella. Immune globulin may be given, but its effectiveness is unknown. After you deliver your newborn, you may be given the rubella vaccine. Follow these instructions at home: If you have rubella or your child has rubella, take these actions to help stop it from spreading:  Stay home or keep your baby home for 7 days after the rash starts.  Wash your hands often with soap and water. If soap and water are not available, use hand sanitizer.  Keep all follow-up visits as told by your health care provider. This is important.  Contact a health care provider if:  You have a fever.  You have inflammation and discharge from your eyes or nose.  You  have a sore throat.  You have a cough.  You have headaches.  You have enlarged lymph nodes.  You have worsening joint or muscle aches. Get help right away if:  You have severe pain in your abdomen.  You no longer feel your baby moving.  You have vaginal bleeding.  You have chest pain.  You have difficulty breathing.  You have large bruises. This information is not intended to replace advice given to you by your health care provider. Make sure you discuss any questions you have with your health care provider. Document Released: 02/26/2001 Document Revised: 07/19/2016 Document Reviewed: 06/26/2016 Elsevier Interactive Patient Education  [employer].

## 2017-12-20 ENCOUNTER — Other Ambulatory Visit: Payer: Self-pay

## 2017-12-20 ENCOUNTER — Other Ambulatory Visit: Payer: Self-pay | Admitting: Certified Nurse Midwife

## 2017-12-20 DIAGNOSIS — B9689 Other specified bacterial agents as the cause of diseases classified elsewhere: Secondary | ICD-10-CM

## 2017-12-20 DIAGNOSIS — N760 Acute vaginitis: Principal | ICD-10-CM

## 2017-12-20 LAB — VAGINITIS/VAGINOSIS, DNA PROBE
Candida Species: NEGATIVE
GARDNERELLA VAGINALIS: POSITIVE — AB
TRICHOMONAS VAG: NEGATIVE

## 2017-12-20 MED ORDER — CLINDAMYCIN PHOSPHATE 2 % VA CREA: TOPICAL_CREAM | VAGINAL | 0 refills | Status: DC

## 2017-12-27 ENCOUNTER — Telehealth: Payer: Self-pay

## 2017-12-27 ENCOUNTER — Telehealth: Payer: Self-pay | Admitting: Certified Nurse Midwife

## 2017-12-27 MED ORDER — OXYCODONE-ACETAMINOPHEN 7.5-325 MG PO TABS: 1.0000 | ORAL_TABLET | Freq: Two times a day (BID) | ORAL | 0 refills | Status: DC | PRN

## 2017-12-27 NOTE — Telephone Encounter (Signed)
Refill done with decreased dosage.

## 2017-12-27 NOTE — Telephone Encounter (Signed)
Call placed to patient to review benefits and to schedule nexplanon removal with Melvia Heaps, CNM. Left voicemail message requesting a return call.   cc: Melvia Heaps, CNM

## 2017-12-27 NOTE — Telephone Encounter (Signed)
Pt called requesting a refill of oxycodone. Please advise if appropriate.

## 2017-12-28 NOTE — Telephone Encounter (Signed)
Pt notified that rx is ready for pick up

## 2018-01-09 ENCOUNTER — Other Ambulatory Visit: Payer: Self-pay | Admitting: *Deleted

## 2018-01-09 DIAGNOSIS — Z3046 Encounter for surveillance of implantable subdermal contraceptive: Secondary | ICD-10-CM

## 2018-01-09 NOTE — Telephone Encounter (Signed)
Patient returned call. Reviewed benefit for nexplanon removal.  Patient understood and agreeable. Patient ready to schedule. Patient scheduled 01/23/18 with Meyer Russel, CNM. Patient aware of appointment date, arrival time and cancellation policy. No further questions. Ok to close  [employer] to [employer], CNM

## 2018-01-23 ENCOUNTER — Other Ambulatory Visit: Payer: Self-pay

## 2018-01-23 ENCOUNTER — Encounter: Payer: Self-pay | Admitting: Certified Nurse Midwife

## 2018-01-23 ENCOUNTER — Ambulatory Visit (INDEPENDENT_AMBULATORY_CARE_PROVIDER_SITE_OTHER): Payer: 59 | Admitting: Certified Nurse Midwife

## 2018-01-23 VITALS — BP 124/80 | HR 70 | Resp 16 | Ht 68.0 in | Wt 179.0 lb

## 2018-01-23 DIAGNOSIS — Z3046 Encounter for surveillance of implantable subdermal contraceptive: Secondary | ICD-10-CM

## 2018-01-23 DIAGNOSIS — Z308 Encounter for other contraceptive management: Secondary | ICD-10-CM | POA: Diagnosis not present

## 2018-01-23 DIAGNOSIS — Z30013 Encounter for initial prescription of injectable contraceptive: Secondary | ICD-10-CM

## 2018-01-23 MED ORDER — MEDROXYPROGESTERONE ACETATE 150 MG/ML IM SUSP
150.0000 mg | Freq: Once | INTRAMUSCULAR | Status: AC
  Administered 2018-01-23: 150 mg via INTRAMUSCULAR

## 2018-01-23 NOTE — Progress Notes (Signed)
89 yrs African  American Single female [phone]  presents for Nexplanon removal.   LMPnone         ContraceptionDepo Provera planned. Questions addressed.  Consent signed. Requests same as above.  HPI neg.  Exam:  Healthy female WDWN Orientation x 3 affect normal  Procedure: Patient placed supine on exam table with herleft arm   flexed at the elbow and externally rotated.The insertion site was identified on the inner side of upper arm.The device was palpated. Incision site for removal was marked. The removal site was cleansed with betadine solution and 1 ml of 1% Lidocaine Lot # [id number] exp. 11/21 was injected at the incision site. A small 2 mm incision was made at the distal end of the implant.The Nexaplon implant was grasped with curved forceps and removed gently intact.The implant was shown to the patient and discarded. The incision was closed with a steri strip.  No active bleeding was noted. A small adhesive bandage placed over the removal site.  A pressure bandage was place over the site.  Assessment:  Nexaplon Removal Pt tolerated procedure well. Planned contraception:The current method of family planning is Depo Provera which will be given today..  Plan :Instructions and warning signs and symptoms given.  Remove bandage in 3 days. Questions addressed Depo Provera 150 mg IM today will be given prior to leaving  3 months for Depo Provera, if decides not proceed with trying for pregnancy, after adjusting hypertension medications with PCP.  Rv prn

## 2018-01-23 NOTE — Patient Instructions (Signed)

## 2018-01-31 ENCOUNTER — Telehealth: Payer: Self-pay | Admitting: *Deleted

## 2018-01-31 NOTE — Telephone Encounter (Signed)
Patient called requesting a refill of her pain medication.  Please advise 

## 2018-01-31 NOTE — Telephone Encounter (Signed)
We don't refill pain medication for chronic foot pain anymore. If there is acute foot problem, she can come in and prescription can be issued for 7 day supply.

## 2018-02-01 NOTE — Telephone Encounter (Signed)
Patient notified and an appointment has been scheduled.acm

## 2018-02-04 ENCOUNTER — Telehealth: Payer: Self-pay | Admitting: *Deleted

## 2018-02-04 ENCOUNTER — Ambulatory Visit (INDEPENDENT_AMBULATORY_CARE_PROVIDER_SITE_OTHER): Payer: 59 | Admitting: Podiatry

## 2018-02-04 ENCOUNTER — Encounter: Payer: Self-pay | Admitting: Podiatry

## 2018-02-04 DIAGNOSIS — M79604 Pain in right leg: Secondary | ICD-10-CM

## 2018-02-04 DIAGNOSIS — M659 Synovitis and tenosynovitis, unspecified: Secondary | ICD-10-CM

## 2018-02-04 MED ORDER — OXYCODONE-ACETAMINOPHEN 7.5-325 MG PO TABS: 1.0000 | ORAL_TABLET | Freq: Three times a day (TID) | ORAL | 0 refills | Status: DC | PRN

## 2018-02-04 NOTE — Patient Instructions (Signed)
Seen for pain in right ankle. Possible tendonitis at the back of right ankle due to change in alignment since STJ Arthroereisis procedure. Right ankle placed in Ankle brace. 7 day supply pain medication prescribed. Return in 2 weeks if problem continues for possible Cortisone injection.

## 2018-02-04 NOTE — Telephone Encounter (Signed)
Patient is in 08 recall for 01/2018. Please contact patient regarding scheduling AEX /PAP -eh

## 2018-02-04 NOTE — Progress Notes (Signed)
Subjective: 37 y.o. year old female patient presents complaining of pain and swelling in right ankle if standing on feet for longer than 3 hours at a time. Today was not on feet much other than getting in and out of building but started to swell and hurt. Patient points posterior to the lateral mallei being the source of pain.  S/P Right STJ Arthroereisis 07/06/17.  Objective: Dermatologic: No abnormal skin lesion noted. Vascular: Pedal pulses are all palpable. Orthopedic: Corrected compensatory hyperpronation on right. Neurologic: All epicritic and tactile sensations grossly intact.  Assessment: S/P STJ arthroereisis right. Strain on Peroneus tendon at posterior lateral ankle on right.  Treatment: Reviewed findings and available treatment options. Right ankle placed in Ankle brace with instruction to use in Tennis shoe. Patient is to return in 2 weeks if pain continues. As per request, 7 day supply pain medication prescribed.

## 2018-02-06 ENCOUNTER — Telehealth: Payer: Self-pay | Admitting: Certified Nurse Midwife

## 2018-02-06 NOTE — Telephone Encounter (Addendum)
Spoke with patient and she has questions about possible pregnancy while on Depo Provera. She had nexplanon removed on 01-23-18 and his was on time and immediately began Depo Provera. She states LMP none since Nexplanon removed  .  She has recently been having urinary frequency and some breast tenderness and concerned about pregnancy. Denies any dysuria, temp or pelvic pressure. Advised patient to do home UPT and if negative to continue monitoring symptoms. If symptoms continue needs ov for evaluation or if positive UPT.

## 2018-02-06 NOTE — Telephone Encounter (Signed)
Patient is due for aex too, so schedule aex and will assess at that time

## 2018-02-06 NOTE — Telephone Encounter (Signed)
Patient was seen on 2/20 for nexplanon removal and depo injection. She has a question about pregnancy.

## 2018-02-07 NOTE — Telephone Encounter (Signed)
aex is 02/13/18

## 2018-02-07 NOTE — Telephone Encounter (Signed)
Spoke with patient. Patient states that she took UPT that was negative. Would like to schedule annual exam. Has time restrictions due to work. Appointment scheduled for 02/13/2018 at 12:45 pm with Melvia Heaps CNM. Patient is agreeable to date and time. Declines all other times.  Routing to provider for final review. Patient agreeable to disposition. Will close encounter.

## 2018-02-13 ENCOUNTER — Ambulatory Visit: Payer: 59 | Admitting: Certified Nurse Midwife

## 2018-03-04 ENCOUNTER — Telehealth: Payer: Self-pay | Admitting: *Deleted

## 2018-03-04 NOTE — Telephone Encounter (Signed)
Patient would like to be referred to [facility]

## 2018-03-05 ENCOUNTER — Encounter: Payer: Self-pay | Admitting: Podiatry

## 2018-03-05 ENCOUNTER — Ambulatory Visit (INDEPENDENT_AMBULATORY_CARE_PROVIDER_SITE_OTHER): Payer: 59 | Admitting: Podiatry

## 2018-03-05 DIAGNOSIS — M25571 Pain in right ankle and joints of right foot: Secondary | ICD-10-CM | POA: Diagnosis not present

## 2018-03-05 DIAGNOSIS — M79604 Pain in right leg: Secondary | ICD-10-CM

## 2018-03-05 MED ORDER — OXYCODONE-ACETAMINOPHEN 7.5-325 MG PO TABS: 1.0000 | ORAL_TABLET | Freq: Three times a day (TID) | ORAL | 0 refills | Status: AC | PRN

## 2018-03-05 NOTE — Patient Instructions (Signed)
Pain in right ankle. Cortisone injection given. May reduce weight bearing activity while in pain. Return as needed.

## 2018-03-05 NOTE — Progress Notes (Signed)
Subjective: 37 y.o. year old female patient presents complaining of pain in right foot and request for an injection.  Stated that her right foot has been hurting more since working overtime.  S/P Right STJ Arthroereisis 07/06/17. S/P Cotton osteotomy left foot (06/02/16), right foot (11/19/15)    Objective: Dermatologic: No abnormal findings.  Vascular: Pedal pulses are all palpable. No edema or erythema noted. Orthopedic: Reduced STJ hyperpronation right following surgery.  Normal ankle joint motion without pain. Pain at right ankle near Sinus tarsi area with prolonged weight bearing. Neurologic: Pain at lateral ankle near sinus tarsi area.  Assessment: Sinus tarsitis right aggravated by prolonged weight bearing with increased work load.  Treatment: Right foot Sinus tarsi area injected with mixture of 4 mg Dexamethasone, 4 mg Triamcinolone, and 1 cc of 0.5% Marcaine plain. Patient tolerated well without difficulty.  Return as needed.

## 2018-03-05 NOTE — Addendum Note (Signed)
Addended by: Camelia Phenes on: 03/05/2018 04:04 PM   Modules accepted: Orders

## 2018-03-06 ENCOUNTER — Telehealth: Payer: Self-pay | Admitting: *Deleted

## 2018-03-06 ENCOUNTER — Ambulatory Visit: Payer: 59 | Admitting: Podiatry

## 2018-03-06 NOTE — Telephone Encounter (Signed)
Patient called and states she believes she had an allergic reaction to the injection administered yesterday. She states once she left the office she became very itchy and red all over.In particular, the palms of her hands became very itchy.  Patient has had lidocaine in the past without a reaction. She called the pharmacy and they advised her to take benadryl.Patient states she took 4 benadryl and the itching has decreased and she is not red. She still does have some itching today. Advised patient that we will add this to her medication list.   Routing for provider review

## 2018-03-19 ENCOUNTER — Ambulatory Visit: Payer: 59 | Admitting: Certified Nurse Midwife

## 2018-03-21 ENCOUNTER — Encounter: Payer: Self-pay | Admitting: *Deleted

## 2018-03-24 ENCOUNTER — Other Ambulatory Visit: Payer: Self-pay

## 2018-03-24 ENCOUNTER — Emergency Department (HOSPITAL_BASED_OUTPATIENT_CLINIC_OR_DEPARTMENT_OTHER)
Admission: EM | Admit: 2018-03-24 | Discharge: 2018-03-24 | Disposition: A | Discharge status: Home / Self Care | Payer: 59 | Attending: Emergency Medicine | Admitting: Emergency Medicine

## 2018-03-24 ENCOUNTER — Encounter (HOSPITAL_BASED_OUTPATIENT_CLINIC_OR_DEPARTMENT_OTHER): Payer: Self-pay | Admitting: Emergency Medicine

## 2018-03-24 DIAGNOSIS — R51 Headache: Secondary | ICD-10-CM | POA: Diagnosis present

## 2018-03-24 DIAGNOSIS — Z87891 Personal history of nicotine dependence: Secondary | ICD-10-CM | POA: Diagnosis not present

## 2018-03-24 DIAGNOSIS — Z79899 Other long term (current) drug therapy: Secondary | ICD-10-CM | POA: Insufficient documentation

## 2018-03-24 DIAGNOSIS — I10 Essential (primary) hypertension: Secondary | ICD-10-CM | POA: Diagnosis not present

## 2018-03-24 DIAGNOSIS — E119 Type 2 diabetes mellitus without complications: Secondary | ICD-10-CM | POA: Diagnosis not present

## 2018-03-24 DIAGNOSIS — F419 Anxiety disorder, unspecified: Secondary | ICD-10-CM | POA: Insufficient documentation

## 2018-03-24 DIAGNOSIS — R519 Headache, unspecified: Secondary | ICD-10-CM

## 2018-03-24 MED ORDER — DIPHENHYDRAMINE HCL 50 MG/ML IJ SOLN
25.0000 mg | Freq: Once | INTRAMUSCULAR | Status: AC
  Administered 2018-03-24: 25 mg via INTRAVENOUS
  Filled 2018-03-24: qty 1

## 2018-03-24 MED ORDER — SODIUM CHLORIDE 0.9 % IV BOLUS
500.0000 mL | Freq: Once | INTRAVENOUS | Status: AC
  Administered 2018-03-24: 500 mL via INTRAVENOUS

## 2018-03-24 MED ORDER — METOCLOPRAMIDE HCL 5 MG/ML IJ SOLN
10.0000 mg | Freq: Once | INTRAMUSCULAR | Status: AC
  Administered 2018-03-24: 10 mg via INTRAVENOUS
  Filled 2018-03-24: qty 2

## 2018-03-24 NOTE — ED Provider Notes (Signed)
[facility] DEPARTMENT Provider Note   CSN: [id number] Arrival date & time: 03/24/18  1505     History   Chief Complaint Chief Complaint  Patient presents with  . Headache    HPI Lindsey Ruiz is a 37 y.o. female.  Patient with h/o migraine headaches presents with complaint of headache which started at the time she woke up at approximately 9 today.  She complains of left-sided head pain around her eye.  She has had eye tearing as well.  Minor pain in the left ear.  Pain is similar to previous headaches however she does not typically have a runny eye.  She sees some spots in her vision.  States that sometimes she has an aura with her migraines.  Denies recent head injury.  Denies other recent headaches.  No runny nose.  No fevers or neck pain.   Patient took home with Toradol without relief.  The onset of this condition was acute. The course is constant. Aggravating factors: none. Alleviating factors: none.       Past Medical History:  Diagnosis Date  . Anxiety   . Hypertension   . Migraine     Patient Active Problem List   Diagnosis Date Noted  . BRCA2 gene mutation negative 12/19/2017  . Herpes simplex 12/19/2017  . Menorrhagia 12/19/2017  . Type 2 diabetes mellitus ([place]) 12/19/2017  . Vitamin D deficiency 12/19/2017  . Breast mass 11/20/2017  . Family history of breast cancer in mother 11/20/2017  . Mastodynia of right breast 01/18/2017  . Status post right foot surgery 11/25/2015  . Tenosynovitis of right foot 11/11/2015  . 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 Pain in lower limb 10/22/2015  . Migraine 04/09/2015  . Essential hypertension 04/09/2015  . 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 Panic attack 09/28/2014  . Anxiety 09/28/2014  . Chronic back pain 09/28/2014    Past Surgical History:  Procedure Laterality Date  . breast cyst    . BREAST LUMPECTOMY   11/22/2017  . BUNIONECTOMY    . Cotton Osteotomy w/ Bone Graft Right 11/19/2015   RT FOOT  . Cotton Osteotomy w/ Bone Graft Left 06/02/2016   LT FOOT  . nexplanon insertion     inserted 09-06-15  . 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 TONSILLECTOMY AND ADENOIDECTOMY       OB History    Gravida  5   Para  2   Term  2   Preterm      AB  3   Living  2     SAB  1   TAB  2   Ectopic      Multiple      Live Births               Home Medications    Prior to Admission medications   Medication Sig Start Date End Date Taking? Authorizing Provider  cetirizine (ZYRTEC) 10 MG tablet Take by mouth. 09/28/16   [provider]  clonazePAM (KLONOPIN) 0.5 MG tablet Take by mouth as needed.  09/24/16   [provider]  cyclobenzaprine (FLEXERIL) 10 MG tablet as needed.  10/17/16   [provider]  oxyCODONE-acetaminophen (PERCOCET) 7.5-325 MG tablet Take 1 tablet by mouth every 8 (eight) hours as needed. 03/05/18   Sheard, Myeong O, DPM  PROAIR HFA 108 (90 Base) MCG/ACT inhaler INHALE 2 PUFFS TID PRN  FOR WHEEZING 12/06/16   [provider]  promethazine (PHENERGAN) 25 MG tablet as needed. 03/28/17   [provider]  valsartan-hydrochlorothiazide (DIOVAN-HCT) 80-12.5 MG tablet occ 03/28/17   [provider]    Family History Family History  Problem Relation Age of Onset  . Breast cancer Mother   . Lung cancer Paternal Aunt   . Diabetes Maternal Grandmother   . Hypertension Maternal Grandfather   . Heart disease Maternal Grandfather        chf  . Esophageal cancer Paternal Grandmother   . Hypertension Paternal Grandfather   . Heart attack Paternal Grandfather   . Diabetes Paternal Aunt     Social History Social History   Tobacco Use  . Smoking status: Former Research scientist (life sciences)  . Smokeless tobacco: Never Used  Substance Use Topics  . Alcohol use: No    Alcohol/week: 0.0 oz  . Drug use: No     Allergies     Kenalog [triamcinolone acetonide]; Pineapple; Penicillins; Sulfa antibiotics; and Sumatriptan   Review of Systems Review of Systems  Constitutional: Negative for fever.  HENT: Negative for congestion, dental problem, rhinorrhea and sinus pressure.   Eyes: Positive for discharge (tearing). Negative for photophobia, redness and visual disturbance.  Respiratory: Negative for shortness of breath.   Cardiovascular: Negative for chest pain.  Gastrointestinal: Negative for nausea and vomiting.  Musculoskeletal: Negative for gait problem, neck pain and neck stiffness.  Skin: Negative for rash.  Neurological: Positive for headaches. Negative for syncope, speech difficulty, weakness, light-headedness and numbness.  Psychiatric/Behavioral: Negative for confusion.     Physical Exam Updated Vital Signs BP (!) 167/104 (BP Location: Left Arm)   Pulse 90   Temp 99.4 F (37.4 C) (Oral)   Resp 18   Ht _0  (1.727 m)   Wt 81.6 kg (180 lb)   SpO2 100%   BMI 27.37 kg/m   Physical Exam  Constitutional: She is oriented to person, place, and time. She appears well-developed and well-nourished.  HENT:  Head: Normocephalic and atraumatic.  Right Ear: Tympanic membrane, external ear and ear canal normal.  Left Ear: Tympanic membrane, external ear and ear canal normal.  Nose: Nose normal.  Mouth/Throat: Uvula is midline, oropharynx is clear and moist and mucous membranes are normal.  Eyes: Pupils are equal, round, and reactive to light. Conjunctivae, EOM and lids are normal. Right eye exhibits no nystagmus. Left eye exhibits no nystagmus.  Neck: Normal range of motion. Neck supple.  Cardiovascular: Normal rate and regular rhythm.  Pulmonary/Chest: Effort normal and breath sounds normal.  Abdominal: Soft. There is no tenderness.  Musculoskeletal:       Cervical back: She exhibits normal range of motion, no tenderness and no bony tenderness.  Neurological: She is alert and oriented to person,  place, and time. She has normal strength and normal reflexes. No cranial nerve deficit or sensory deficit. She displays a negative Romberg sign. Coordination and gait normal. GCS eye subscore is 4. GCS verbal subscore is 5. GCS motor subscore is 6.  Skin: Skin is warm and dry.  Psychiatric: She has a normal mood and affect.  Nursing note and vitals reviewed.    ED Treatments / Results  Labs (all labs ordered are listed, but only abnormal results are displayed) Labs Reviewed - No data to display  EKG None  Radiology No results found.  Procedures Procedures (including critical care time)  Medications Ordered in ED Medications  metoCLOPramide (REGLAN) injection 10 mg (10 mg Intravenous Given  03/24/18 1648)  diphenhydrAMINE (BENADRYL) injection 25 mg (25 mg Intravenous Given 03/24/18 1649)  sodium chloride 0.9 % bolus 500 mL (0 mLs Intravenous Stopped 03/24/18 1745)     Initial Impression / Assessment and Plan / ED Course  I have reviewed the triage vital signs and the nursing notes.  Pertinent labs & imaging results that were available during my care of the patient were reviewed by me and considered in my medical decision making (see chart for details).     Patient seen and examined.  Medications ordered.   Vital signs reviewed and are as follows: BP (!) 167/104 (BP Location: Left Arm)   Pulse 90   Temp 99.4 F (37.4 C) (Oral)   Resp 18   Ht _0  (1.727 m)   Wt 81.6 kg (180 lb)   SpO2 100%   BMI 27.37 kg/m   7:19 PM patient re-examined.  She is feeling much better after Reglan and Benadryl.  Headache has gone from 7 out of 10 to 2 out of 10.  She reports no further eye drainage.  She is comfortable with discharge to home at this time.  Patient counseled to return if they have weakness in their arms or legs, slurred speech, trouble walking or talking, confusion, trouble with their balance, or if they have any other concerns. Patient verbalizes understanding and agrees  with plan.    Final Clinical Impressions(s) / ED Diagnoses   Final diagnoses:  Acute nonintractable headache, unspecified headache type   Patient without high-risk features of headache including: sudden onset/thunderclap HA, altered mental status, accompanying seizure, headache with exertion, age > 72, history of immunocompromise, neck or shoulder pain, fever, use of anticoagulation, family history of spontaneous SAH, concomitant drug use, toxic exposure. Headache with eye draining is atypical for patient however this is not apparent at time of exam and symptoms have lasted longer than I would typically expect to see with a cluster headache.  Patient has a normal complete neurological exam, normal vital signs, normal level of consciousness, no signs of meningismus, is well-appearing/non-toxic appearing, no signs of trauma. .   Imaging with CT/MRI not indicated given history and physical exam findings.   No dangerous or life-threatening conditions suspected or identified by history, physical exam, and by work-up. No indications for hospitalization identified.    ED Discharge Orders    None       Carlisle Cater, PA-C 03/24/18 [place], [place], DO 03/24/18 2258

## 2018-03-24 NOTE — ED Triage Notes (Signed)
Patient states that she woke up this am with a Headache and seeing spots. The patient reports that she has a hx of MHA  - denies any N/V

## 2018-03-24 NOTE — ED Notes (Signed)
ED Provider at bedside. 

## 2018-03-24 NOTE — Discharge Instructions (Signed)
Please read and follow all provided instructions.  Your diagnoses today include:  1. Acute nonintractable headache, unspecified headache type     Tests performed today include:  Vital signs. See below for your results today.   Medications:  In the Emergency Department you received:  Reglan - antinausea/headache medication  Benadryl - antihistamine to counteract potential side effects of reglan  Take any prescribed medications only as directed.  Additional information:  Follow any educational materials contained in this packet.  You are having a headache. No specific cause was found today for your headache. It may have been a migraine or other cause of headache. Stress, anxiety, fatigue, and depression are common triggers for headaches.   Your headache today does not appear to be life-threatening or require hospitalization, but often the exact cause of headaches is not determined in the emergency department. Therefore, follow-up with your doctor is very important to find out what may have caused your headache and whether or not you need any further diagnostic testing or treatment.   Sometimes headaches can appear benign (not harmful), but then more serious symptoms can develop which should prompt an immediate re-evaluation by your doctor or the emergency department.  BE VERY CAREFUL not to take multiple medicines containing Tylenol (also called acetaminophen). Doing so can lead to an overdose which can damage your liver and cause liver failure and possibly death.   Follow-up instructions: Please follow-up with your primary care provider in the next 3 days for further evaluation of your symptoms.   Return instructions:   Please return to the Emergency Department if you experience worsening symptoms.  Return if the medications do not resolve your headache, if it recurs, or if you have multiple episodes of vomiting or cannot keep down fluids.  Return if you have a change from the  usual headache.  RETURN IMMEDIATELY IF you:  Develop a sudden, severe headache  Develop confusion or become poorly responsive or faint  Develop a fever above 100.54F or problem breathing  Have a change in speech, vision, swallowing, or understanding  Develop new weakness, numbness, tingling, incoordination in your arms or legs  Have a seizure  Please return if you have any other emergent concerns.  Additional Information:  Your vital signs today were: BP (!) 133/91 (BP Location: Left Arm)    Pulse 85    Temp 98.1 F (36.7 C) (Oral)    Resp 20    Ht 5\' 8"  (1.727 m)    Wt 81.6 kg (180 lb)    SpO2 99%    BMI 27.37 kg/m  If your blood pressure (BP) was elevated above 135/85 this visit, please have this repeated by your doctor within one month. --------------

## 2018-04-12 ENCOUNTER — Ambulatory Visit (INDEPENDENT_AMBULATORY_CARE_PROVIDER_SITE_OTHER): Payer: 59 | Admitting: Certified Nurse Midwife

## 2018-04-12 ENCOUNTER — Other Ambulatory Visit: Payer: Self-pay

## 2018-04-12 ENCOUNTER — Other Ambulatory Visit (HOSPITAL_COMMUNITY)
Admission: RE | Admit: 2018-04-12 | Discharge: 2018-04-12 | Disposition: A | Discharge status: Home / Self Care | Payer: 59 | Source: Ambulatory Visit | Attending: Certified Nurse Midwife | Admitting: Certified Nurse Midwife

## 2018-04-12 ENCOUNTER — Encounter: Payer: Self-pay | Admitting: Certified Nurse Midwife

## 2018-04-12 VITALS — BP 130/84 | HR 70 | Resp 16 | Ht 67.75 in | Wt 179.0 lb

## 2018-04-12 DIAGNOSIS — Z789 Other specified health status: Secondary | ICD-10-CM | POA: Diagnosis not present

## 2018-04-12 DIAGNOSIS — Z124 Encounter for screening for malignant neoplasm of cervix: Secondary | ICD-10-CM

## 2018-04-12 DIAGNOSIS — Z113 Encounter for screening for infections with a predominantly sexual mode of transmission: Secondary | ICD-10-CM | POA: Diagnosis not present

## 2018-04-12 DIAGNOSIS — Z01419 Encounter for gynecological examination (general) (routine) without abnormal findings: Secondary | ICD-10-CM

## 2018-04-12 NOTE — Patient Instructions (Signed)
General topics  Next pap or exam is  due in 1 year Take a Women's multivitamin Take 1200 mg. of calcium daily - prefer dietary If any concerns in interim to call back  Breast Self-Awareness Practicing breast self-awareness may pick up problems early, prevent significant medical complications, and possibly save your life. By practicing breast self-awareness, you can become familiar with how your breasts look and feel and if your breasts are changing. This allows you to notice changes early. It can also offer you some reassurance that your breast health is good. One way to learn what is normal for your breasts and whether your breasts are changing is to do a breast self-exam. If you find a lump or something that was not present in the past, it is best to contact your caregiver right away. Other findings that should be evaluated by your caregiver include nipple discharge, especially if it is bloody; skin changes or reddening; areas where the skin seems to be pulled in (retracted); or new lumps and bumps. Breast pain is seldom associated with cancer (malignancy), but should also be evaluated by a caregiver. BREAST SELF-EXAM The best time to examine your breasts is 5 7 days after your menstrual period is over.  ExitCare Patient Information 2013 ExitCare, LLC.   Exercise to Stay Healthy Exercise helps you become and stay healthy. EXERCISE IDEAS AND TIPS Choose exercises that:  You enjoy.  Fit into your day. You do not need to exercise really hard to be healthy. You can do exercises at a slow or medium level and stay healthy. You can:  Stretch before and after working out.  Try yoga, Pilates, or tai chi.  Lift weights.  Walk fast, swim, jog, run, climb stairs, bicycle, dance, or rollerskate.  Take aerobic classes. Exercises that burn about 150 calories:  Running 1  miles in 15 minutes.  Playing volleyball for 45 to 60 minutes.  Washing and waxing a car for 45 to 60  minutes.  Playing touch football for 45 minutes.  Walking 1  miles in 35 minutes.  Pushing a stroller 1  miles in 30 minutes.  Playing basketball for 30 minutes.  Raking leaves for 30 minutes.  Bicycling 5 miles in 30 minutes.  Walking 2 miles in 30 minutes.  Dancing for 30 minutes.  Shoveling snow for 15 minutes.  Swimming laps for 20 minutes.  Walking up stairs for 15 minutes.  Bicycling 4 miles in 15 minutes.  Gardening for 30 to 45 minutes.  Jumping rope for 15 minutes.  Washing windows or floors for 45 to 60 minutes. Document Released: 12/23/2010 Document Revised: 02/12/2012 Document Reviewed: 12/23/2010 ExitCare Patient Information 2013 ExitCare[employer].   Other topics ( that may be useful information):    Sexually Transmitted Disease Sexually transmitted disease (STD) refers to any infection that is passed from person to person during sexual activity. This may happen by way of saliva, semen, blood, vaginal mucus, or urine. Common STDs include:  Gonorrhea.  Chlamydia.  Syphilis.  HIV/AIDS.  Genital herpes.  Hepatitis B and C.  Trichomonas.  Human papillomavirus (HPV).  Pubic lice. CAUSES  An STD may be spread by bacteria, virus, or parasite. A person can get an STD by:  Sexual intercourse with an infected person.  Sharing sex toys with an infected person.  Sharing needles with an infected person.  Having intimate contact with the genitals, mouth, or rectal areas of an infected person. SYMPTOMS  Some people may not have any symptoms, but   they can still pass the infection to others. Different STDs have different symptoms. Symptoms include:  Painful or bloody urination.  Pain in the pelvis, abdomen, vagina, anus, throat, or eyes.  Skin rash, itching, irritation, growths, or sores (lesions). These usually occur in the genital or anal area.  Abnormal vaginal discharge.  Penile discharge in men.  Soft, flesh-colored skin growths in the  genital or anal area.  Fever.  Pain or bleeding during sexual intercourse.  Swollen glands in the groin area.  Yellow skin and eyes (jaundice). This is seen with hepatitis. DIAGNOSIS  To make a diagnosis, your caregiver may:  Take a medical history.  Perform a physical exam.  Take a specimen (culture) to be examined.  Examine a sample of discharge under a microscope.  Perform blood test TREATMENT   Chlamydia, gonorrhea, trichomonas, and syphilis can be cured with antibiotic medicine.  Genital herpes, hepatitis, and HIV can be treated, but not cured, with prescribed medicines. The medicines will lessen the symptoms.  Genital warts from HPV can be treated with medicine or by freezing, burning (electrocautery), or surgery. Warts may come back.  HPV is a virus and cannot be cured with medicine or surgery.However, abnormal areas may be followed very closely by your caregiver and may be removed from the cervix, vagina, or vulva through office procedures or surgery. If your diagnosis is confirmed, your recent sexual partners need treatment. This is true even if they are symptom-free or have a negative culture or evaluation. They should not have sex until their caregiver says it is okay. HOME CARE INSTRUCTIONS  All sexual partners should be informed, tested, and treated for all STDs.  Take your antibiotics as directed. Finish them even if you start to feel better.  Only take over-the-counter or prescription medicines for pain, discomfort, or fever as directed by your caregiver.  Rest.  Eat a balanced diet and drink enough fluids to keep your urine clear or pale yellow.  Do not have sex until treatment is completed and you have followed up with your caregiver. STDs should be checked after treatment.  Keep all follow-up appointments, Pap tests, and blood tests as directed by your caregiver.  Only use latex condoms and water-soluble lubricants during sexual activity. Do not use  petroleum jelly or oils.  Avoid alcohol and illegal drugs.  Get vaccinated for HPV and hepatitis. If you have not received these vaccines in the past, talk to your caregiver about whether one or both might be right for you.  Avoid risky sex practices that can break the skin. The only way to avoid getting an STD is to avoid all sexual activity.Latex condoms and dental dams (for oral sex) will help lessen the risk of getting an STD, but will not completely eliminate the risk. SEEK MEDICAL CARE IF:   You have a fever.  You have any new or worsening symptoms. Document Released: 02/10/2003 Document Revised: 02/12/2012 Document Reviewed: 02/17/2011 [facility] Patient Information 2013 [employer].    Domestic Abuse You are being battered or abused if someone close to you hits, pushes, or physically hurts you in any way. You also are being abused if you are forced into activities. You are being sexually abused if you are forced to have sexual contact of any kind. You are being emotionally abused if you are made to feel worthless or if you are constantly threatened. It is important to remember that help is available. No one has the right to abuse you. PREVENTION OF FURTHER  ABUSE  Learn the warning signs of danger. This varies with situations but may include: the use of alcohol, threats, isolation from friends and family, or forced sexual contact. Leave if you feel that violence is going to occur.  If you are attacked or beaten, report it to the police so the abuse is documented. You do not have to press charges. The police can protect you while you or the attackers are leaving. Get the officer's name and badge number and a copy of the report.  Find someone you can trust and tell them what is happening to you: your caregiver, a nurse, clergy member, close friend or family member. Feeling ashamed is natural, but remember that you have done nothing wrong. No one deserves abuse. Document Released:  11/17/2000 Document Revised: 02/12/2012 Document Reviewed: 01/26/2011 ExitCare Patient Information 2013 ExitCare[employer] LLC.    How Much is Too Much Alcohol? Drinking too much alcohol can cause injury, accidents, and health problems. These types of problems can include:   Car crashes.  Falls.  Family fighting (domestic violence).  Drowning.  Fights.  Injuries.  Burns.  Damage to certain organs.  Having a baby with birth defects. ONE DRINK CAN BE TOO MUCH WHEN YOU ARE:  Working.  Pregnant or breastfeeding.  Taking medicines. Ask your doctor.  Driving or planning to drive. If you or someone you know has a drinking problem, get help from a doctor.  Document Released: 09/16/2009 Document Revised: 02/12/2012 Document Reviewed: 09/16/2009 ExitCare Patient Information 2013 [employer]Care[employer] LLC.   Smoking Hazards Smoking cigarettes is extremely bad for your health. Tobacco smoke has over 200 known poisons in it. There are over 60 chemicals in tobacco smoke that cause cancer. Some of the chemicals found in cigarette smoke include:   Cyanide.  Benzene.  Formaldehyde.  Methanol (wood alcohol).  Acetylene (fuel used in welding torches).  Ammonia. Cigarette smoke also contains the poisonous gases nitrogen oxide and carbon monoxide.  Cigarette smokers have an increased risk of many serious medical problems and Smoking causes approximately:  90% of all lung cancer deaths in men.  80% of all lung cancer deaths in women.  90% of deaths from chronic obstructive lung disease. Compared with nonsmokers, smoking increases the risk of:  Coronary heart disease by 2 to 4 times.  Stroke by 2 to 4 times.  Men developing lung cancer by 23 times.  Women developing lung cancer by 13 times.  Dying from chronic obstructive lung diseases by 12 times.  . Smoking is the most preventable cause of death and disease in our society.  WHY IS SMOKING ADDICTIVE?  Nicotine is the chemical  agent in tobacco that is capable of causing addiction or dependence.  When you smoke and inhale, nicotine is absorbed rapidly into the bloodstream through your lungs. Nicotine absorbed through the lungs is capable of creating a powerful addiction. Both inhaled and non-inhaled nicotine may be addictive.  Addiction studies of cigarettes and spit tobacco show that addiction to nicotine occurs mainly during the teen years, when young people begin using tobacco products. WHAT ARE THE BENEFITS OF QUITTING?  There are many health benefits to quitting smoking.   Likelihood of developing cancer and heart disease decreases. Health improvements are seen almost immediately.  Blood pressure, pulse rate, and breathing patterns start returning to normal soon after quitting. QUITTING SMOKING   American Lung Association - 1-800-LUNGUSA  American Cancer Society - 1-800-ACS-2345 Document Released: 12/28/2004 Document Revised: 02/12/2012 Document Reviewed: 09/01/2009 ExitCare Patient Information 2013 ExitCare,   LLC.   Stress Management Stress is a state of physical or mental tension that often results from changes in your life or normal routine. Some common causes of stress are:  Death of a loved one.  Injuries or severe illnesses.  Getting fired or changing jobs.  Moving into a new home. Other causes may be:  Sexual problems.  Business or financial losses.  Taking on a large debt.  Regular conflict with someone at home or at work.  Constant tiredness from lack of sleep. It is not just bad things that are stressful. It may be stressful to:  Win the lottery.  Get married.  Buy a new car. The amount of stress that can be easily tolerated varies from person to person. Changes generally cause stress, regardless of the types of change. Too much stress can affect your health. It may lead to physical or emotional problems. Too little stress (boredom) may also become stressful. SUGGESTIONS TO  REDUCE STRESS:  Talk things over with your family and friends. It often is helpful to share your concerns and worries. If you feel your problem is serious, you may want to get help from a professional counselor.  Consider your problems one at a time instead of lumping them all together. Trying to take care of everything at once may seem impossible. List all the things you need to do and then start with the most important one. Set a goal to accomplish 2 or 3 things each day. If you expect to do too many in a single day you will naturally fail, causing you to feel even more stressed.  Do not use alcohol or drugs to relieve stress. Although you may feel better for a short time, they do not remove the problems that caused the stress. They can also be habit forming.  Exercise regularly - at least 3 times per week. Physical exercise can help to relieve that "uptight" feeling and will relax you.  The shortest distance between despair and hope is often a good night's sleep.  Go to bed and get up on time allowing yourself time for appointments without being rushed.  Take a short "time-out" period from any stressful situation that occurs during the day. Close your eyes and take some deep breaths. Starting with the muscles in your face, tense them, hold it for a few seconds, then relax. Repeat this with the muscles in your neck, shoulders, hand, stomach, back and legs.  Take good care of yourself. Eat a balanced diet and get plenty of rest.  Schedule time for having fun. Take a break from your daily routine to relax. HOME CARE INSTRUCTIONS   Call if you feel overwhelmed by your problems and feel you can no longer manage them on your own.  Return immediately if you feel like hurting yourself or someone else. Document Released: 05/16/2001 Document Revised: 02/12/2012 Document Reviewed: 01/06/2008 ExitCare Patient Information 2013 ExitCare, LLC.   
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
## 2018-04-13 LAB — HEP, RPR, HIV PANEL
HEP B S AG: NEGATIVE
HIV SCREEN 4TH GENERATION: NONREACTIVE
RPR Ser Ql: NONREACTIVE

## 2018-04-13 LAB — VAGINITIS/VAGINOSIS, DNA PROBE
CANDIDA SPECIES: NEGATIVE
Gardnerella vaginalis: POSITIVE — AB
Trichomonas vaginosis: NEGATIVE

## 2018-04-13 LAB — GC/CHLAMYDIA PROBE AMP
CHLAMYDIA, DNA PROBE: NEGATIVE
Neisseria gonorrhoeae by PCR: NEGATIVE

## 2018-04-13 LAB — HEPATITIS C ANTIBODY: Hep C Virus Ab: <0.1 s/co ratio (ref 0.0–0.9)

## 2018-04-13 LAB — RUBELLA SCREEN: Rubella Antibodies, IGG: 1.51 index (ref >0.99)

## 2018-04-17 ENCOUNTER — Other Ambulatory Visit: Payer: Self-pay | Admitting: Certified Nurse Midwife

## 2018-04-17 DIAGNOSIS — R87810 Cervical high risk human papillomavirus (HPV) DNA test positive: Principal | ICD-10-CM

## 2018-04-17 DIAGNOSIS — R87610 Atypical squamous cells of undetermined significance on cytologic smear of cervix (ASC-US): Secondary | ICD-10-CM

## 2018-04-17 LAB — CYTOLOGY - PAP
Diagnosis: UNDETERMINED — AB
HPV: DETECTED — AB

## 2018-04-18 ENCOUNTER — Telehealth: Payer: Self-pay | Admitting: Certified Nurse Midwife

## 2018-04-18 ENCOUNTER — Other Ambulatory Visit: Payer: Self-pay

## 2018-04-18 MED ORDER — NONFORMULARY OR COMPOUNDED ITEM: 0 refills | Status: DC

## 2018-04-18 NOTE — Telephone Encounter (Signed)
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 She will need colposcopy evaluation. Needs to continue with condom use for contraception. She may still be covered with Depo Provera. Order placed

## 2018-04-18 NOTE — Telephone Encounter (Signed)
Patient states she received a call from a nurse. No open telephone note? Patient last seen 04/12/18.

## 2018-04-18 NOTE — Telephone Encounter (Signed)
Received faxed refill request from [facility] for the following:  Medication refill request: Boric Acid Cap.#30 Last AEX:  04-12-18 Next AEX: 04-22-19 Last MMG (if hormonal medication request): 01-10-17 Rt.Br.U/S Neg Refill authorized: Please advise

## 2018-04-18 NOTE — Telephone Encounter (Signed)
Spoke with patient, advised as seen below per Melvia Heaps, CNM.   1. Patient request Rx for Boric acid vaginal suppositories be faxed to [facility]. See previous result note, Rx faxed 02/16/18.   2. Reviewed options for colpo scheduling, patient has to review schedule with employer and return call. No colpo scheduled, will await return call.   Reminded patient to continue consistent condom use until after colpo completed. Benefits will be reviewed and call returned prior to procedure.   Brief explanation of colpo provided, questions answered, patient verbalizes understanding and is agreeable.

## 2018-04-23 NOTE — Telephone Encounter (Signed)
Call placed to convey benefits. 

## 2018-05-02 MED ORDER — METRONIDAZOLE 500 MG PO TABS: 500.0000 mg | ORAL_TABLET | Freq: Two times a day (BID) | ORAL | 0 refills | Status: DC

## 2018-05-02 NOTE — Telephone Encounter (Signed)
Patient returned call

## 2018-05-02 NOTE — Telephone Encounter (Signed)
Spoke with patient, advised as seen below per Dr. Quincy Simmonds. Rx for flagyl po to verified pharmacy on file. ETOH precautions reviewed. Patient aware to return call to office if chance of pregnancy or if menses starts prior to colpo. Patient verbalizes understanding and is agreeable.   Routing to provider for final review. Patient is agreeable to disposition. Will close encounter.

## 2018-05-02 NOTE — Telephone Encounter (Signed)
Spoke with patient in f/u to recommended colpo. Patient declined multiple appts offered, states she will have to schedule on or after 3pm on a M/W/F. Patient agreeable to scheduling with first available provider to meet her scheduling needs.   Colpo scheduled on 05/13/18 at 3pm with Dr. Quincy Simmonds.   Patient will complete boric acid vaginal suppositories on 5/30. Has been using consistent condom use since AEX 04/12/18. Depo expired on 04/24/18, no menses with depo injection.   Advised patient will review with Dr. Quincy Simmonds and Melvia Heaps, CNM and return call if any additional recommendations.   Call transferred to [facility] to review benefits.   Dr. Quincy Simmonds -please review, ok to proceed with colpo as scheduled?   Cc: Melvia Heaps, CNM, Magdalene Patricia

## 2018-05-02 NOTE — Telephone Encounter (Signed)
I would recommend treatment for bacterial vaginosis with either Metrogel pv at hs x 5 nights or Flagyl 500 mg po bid x 7 days in preparation for the colposcopy.  Ok for colposcopy as scheduled as long as she is not having her menses at the time and as long as she is not have unprotected intercourse prior to the colposcopy.  UPT will be done on the day of her visit.   Cc- Lindsey Ruiz

## 2018-05-02 NOTE — Telephone Encounter (Signed)
Left detailed message, ok per dpr. Advised as seen below per Dr. Quincy Simmonds. Instructed patient to return call to office to advise on option for BV tx. Return call to [facility] at [phone].

## 2018-05-08 ENCOUNTER — Ambulatory Visit: Payer: 59 | Admitting: Certified Nurse Midwife

## 2018-05-08 ENCOUNTER — Encounter

## 2018-05-13 ENCOUNTER — Other Ambulatory Visit: Payer: Self-pay

## 2018-05-13 ENCOUNTER — Encounter: Payer: Self-pay | Admitting: Obstetrics and Gynecology

## 2018-05-13 ENCOUNTER — Ambulatory Visit: Payer: 59 | Admitting: Obstetrics and Gynecology

## 2018-05-13 VITALS — BP 150/98 | HR 72 | Resp 16 | Ht 67.75 in | Wt 181.0 lb

## 2018-05-13 DIAGNOSIS — R87810 Cervical high risk human papillomavirus (HPV) DNA test positive: Secondary | ICD-10-CM

## 2018-05-13 DIAGNOSIS — Z01812 Encounter for preprocedural laboratory examination: Secondary | ICD-10-CM | POA: Diagnosis not present

## 2018-05-13 DIAGNOSIS — R87610 Atypical squamous cells of undetermined significance on cytologic smear of cervix (ASC-US): Secondary | ICD-10-CM | POA: Diagnosis not present

## 2018-05-13 LAB — POCT URINE PREGNANCY: PREG TEST UR: NEGATIVE

## 2018-05-13 NOTE — Patient Instructions (Signed)

## 2018-05-13 NOTE — Progress Notes (Signed)
  Subjective:     Patient ID: Lindsey Ruiz, female   DOB: 01-Dec-1981, 37 y.o.   MRN: [id number]  HPI Pap History: 04-12-18 ASCUS:Pos HR HPV 01-05-17 Neg:Pos HPV HR, 16 18/45 neg 04-21-13 Neg:Neg HR HPV Review of Systems  LMP: unknown - patient discontinued Nexplanon and had Depo-Provera administered 01/23/18. Patient did not receive next Depo injection.  No period since.  Considering childbearing.  Contraception: Condoms not every time.  Last unprotected intercourse was 5/29,5/30.  UPT: negative.  Completed course of Flagyl prior to colposcopy today.   Not a smoker.      Objective:   Physical Exam  Genitourinary:    Colposcopy Consent for procedure.  3% acetic acid used.  White light and green light filter used.  Colposcopy satisfactory.  ECC taken.  Acetowhite change at 3:00 and 12:00 on the exocervix.  Biopsy of each taken separately.  Scattered small salt and pepper type acetowhite lesions of the posterior vaginal cuff.  Biopsy taken.  4 specimens sent to pathology separately.  Monsel's applied.  Minimal EBL.  No complications.     Assessment:     ASCUS, positive HR HPV.  Recent BV.  Elevated blood pressure reading.  BP in acceptable range at recent well woman visit.  On Diovan HCT. Desire for future childbearing.      Plan:     We discussed abnormal pap smears, HPV, colposcopy and LEEP procedure.  FU biopsy results.  Instructions and precautions given.  Will need to switch off Diovan HCT for pregnancy.   After visit summary to patient.

## 2018-05-17 ENCOUNTER — Telehealth: Payer: Self-pay

## 2018-05-17 NOTE — Telephone Encounter (Signed)
Spoke with patient. Results given. Patient verbalizes understanding. 08 recall placed. Encounter closed.

## 2018-05-17 NOTE — Telephone Encounter (Signed)
-----   Message from Nunzio Cobbs, MD sent at 05/16/2018  7:44 PM EDT ----- Please report results of colposcopy showing atypia.  She has no signs of cancer.  Her ECC and vaginal biopsy were negative.  I am recommending a pap and HR HPV in one year.  Pap recall - 08. Recall.  [place].

## 2018-06-06 ENCOUNTER — Encounter (HOSPITAL_BASED_OUTPATIENT_CLINIC_OR_DEPARTMENT_OTHER): Payer: Self-pay | Admitting: Emergency Medicine

## 2018-06-06 ENCOUNTER — Other Ambulatory Visit: Payer: Self-pay

## 2018-06-06 ENCOUNTER — Emergency Department (HOSPITAL_BASED_OUTPATIENT_CLINIC_OR_DEPARTMENT_OTHER)
Admission: EM | Admit: 2018-06-06 | Discharge: 2018-06-06 | Disposition: A | Discharge status: Home / Self Care | Payer: 59 | Attending: Emergency Medicine | Admitting: Emergency Medicine

## 2018-06-06 DIAGNOSIS — W228XXA Striking against or struck by other objects, initial encounter: Secondary | ICD-10-CM | POA: Insufficient documentation

## 2018-06-06 DIAGNOSIS — I10 Essential (primary) hypertension: Secondary | ICD-10-CM | POA: Diagnosis not present

## 2018-06-06 DIAGNOSIS — E119 Type 2 diabetes mellitus without complications: Secondary | ICD-10-CM | POA: Diagnosis not present

## 2018-06-06 DIAGNOSIS — Y9389 Activity, other specified: Secondary | ICD-10-CM | POA: Insufficient documentation

## 2018-06-06 DIAGNOSIS — M79645 Pain in left finger(s): Secondary | ICD-10-CM | POA: Insufficient documentation

## 2018-06-06 DIAGNOSIS — Y929 Unspecified place or not applicable: Secondary | ICD-10-CM | POA: Diagnosis not present

## 2018-06-06 DIAGNOSIS — Y999 Unspecified external cause status: Secondary | ICD-10-CM | POA: Insufficient documentation

## 2018-06-06 DIAGNOSIS — Z87891 Personal history of nicotine dependence: Secondary | ICD-10-CM | POA: Diagnosis not present

## 2018-06-06 MED ORDER — DOXYCYCLINE HYCLATE 100 MG PO CAPS: 100.0000 mg | ORAL_CAPSULE | Freq: Two times a day (BID) | ORAL | 0 refills | Status: AC

## 2018-06-06 NOTE — Discharge Instructions (Signed)
You were seen in the ED today with drainage and infection at the tips of the fingers on the left hand. Keep this area clean and dry. You can apply neosporin to the fingers and apply a gauze dressing that you should change daily for the next 5 days. Take the antibiotic as prescribed. Return to the ED if you develop worsening finger pain, redness, or swelling. The finger nails may fall off but they will grow back if that happens.

## 2018-06-06 NOTE — ED Notes (Signed)
Pt verbalizes understanding of d/c instructions and denies any further needs at this time. 

## 2018-06-06 NOTE — ED Provider Notes (Signed)
Emergency Department Provider Note   I have reviewed the triage vital signs and the nursing notes.   HISTORY  Chief Complaint Hand Pain   HPI Lindsey Ruiz is a 37 y.o. female with PMH of HTN's to the emergency department for evaluation of drainage and pain from the tips of the ring and little fingers on the left hand.  Patient states that she had been wearing nails on that hand but hit the fingers into trunk accidentally while opening her car.  She removed the nails and has since developed pain and drainage at the tips of the fingers.  No redness or swelling in the fingers themselves.  Denies any fevers or chills.  No numbness or tingling.  He notes mild to moderate pain with movement of the fingers of the left hand. Denies any IVDA.     Past Medical History:  Diagnosis Date  . Abnormal Pap smear of cervix    01-05-17 neg HPV HR + 16,18/45 neg  . Anxiety   . Hypertension   . Migraine     Patient Active Problem List   Diagnosis Date Noted  . BRCA2 gene mutation negative 12/19/2017  . Herpes simplex 12/19/2017  . Menorrhagia 12/19/2017  . Type 2 diabetes mellitus ([place]) 12/19/2017  . Vitamin D deficiency 12/19/2017  . Breast mass 11/20/2017  . Family history of breast cancer in mother 11/20/2017  . Mastodynia of right breast 01/18/2017  . Status post right foot surgery 11/25/2015  . Tenosynovitis of right foot 11/11/2015  . 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 Pain in lower limb 10/22/2015  . Migraine 04/09/2015  . Essential hypertension 04/09/2015  . 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 Panic attack 09/28/2014  . Anxiety 09/28/2014  . Chronic back pain 09/28/2014    Past Surgical History:  Procedure Laterality Date  . breast cyst    . BREAST LUMPECTOMY  11/22/2017  . BUNIONECTOMY    . Cotton Osteotomy w/ Bone Graft Right 11/19/2015   RT FOOT  . Cotton Osteotomy w/ Bone Graft Left  06/02/2016   LT FOOT  . nexplanon insertion     inserted 09-06-15  . 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 TONSILLECTOMY AND ADENOIDECTOMY       Allergies Kenalog [triamcinolone acetonide]; Pineapple; Penicillins; Sulfa antibiotics; and Sumatriptan  Family History  Problem Relation Age of Onset  . Breast cancer Mother   . Lung cancer Paternal Aunt   . Diabetes Maternal Grandmother   . Hypertension Maternal Grandfather   . Heart disease Maternal Grandfather        chf  . Esophageal cancer Paternal Grandmother   . Hypertension Paternal Grandfather   . Heart attack Paternal Grandfather   . Diabetes Paternal Aunt   . Stroke Father     Social History Social History   Tobacco Use  . Smoking status: Former Research scientist (life sciences)  . Smokeless tobacco: Never Used  Substance Use Topics  . Alcohol use: No    Alcohol/week: 0.0 oz  . Drug use: No    Review of Systems  Constitutional: No fever/chills Eyes: No visual changes. ENT: No sore throat. Cardiovascular: Denies chest pain. Respiratory: Denies shortness of breath. Gastrointestinal: No abdominal pain.  No nausea, no vomiting.  No diarrhea.  No constipation. Genitourinary: Negative for dysuria. Musculoskeletal: Negative for back pain. Skin: Drainage from the finger tips in left ring and little fingers.  Neurological: Negative for headaches,  focal weakness or numbness.  10-point ROS otherwise negative.  ____________________________________________   PHYSICAL EXAM:  VITAL SIGNS: ED Triage Vitals  Enc Vitals Group     BP 06/06/18 0215 118/86     Pulse Rate 06/06/18 0215 100     Resp 06/06/18 0215 18     Temp 06/06/18 0215 98.6 F (37 C)     Temp Source 06/06/18 0215 Oral     SpO2 06/06/18 0215 98 %     Weight 06/06/18 0215 179 lb (81.2 kg)     Height 06/06/18 0215 _0  (1.702 m)     Pain Score 06/06/18 0221 5   Constitutional: Alert and oriented. Well appearing and in no acute distress. Eyes: Conjunctivae are  normal.  Head: Atraumatic. Nose: No congestion/rhinnorhea. Mouth/Throat: Mucous membranes are moist.  Oropharynx non-erythematous. Neck: No stridor.  Cardiovascular: Normal rate, regular rhythm. Good peripheral circulation. Grossly normal heart sounds. No murmurs.  Respiratory: Normal respiratory effort.  No retractions. Lungs CTAB. Gastrointestinal: Soft and nontender. No distention.  Musculoskeletal: No lower extremity tenderness nor edema. No gross deformities of extremities. Neurologic:  Normal speech and language. No gross focal neurologic deficits are appreciated.  Skin:  Skin is warm and dry. Some mild discharge and erosion distal portion of 4th and 5th digits on the left. No felon or paronychia. No concern for subungal abscess. No finger swelling/redness. No hand/forearm redness or discomfort.   ____________________________________________  RADIOLOGY  None ____________________________________________   PROCEDURES  Procedure(s) performed:   Procedures  None ____________________________________________   INITIAL IMPRESSION / ASSESSMENT AND PLAN / ED COURSE  Pertinent labs & imaging results that were available during my care of the patient were reviewed by me and considered in my medical decision making (see chart for details).  Patient presents to the emergency department with drainage from the tips of the fourth and fifth digits of the left hand.  No concern for drainable abscess.  Patient denies IV drug abuse.  She has no murmurs.  She is afebrile.  Plan for basic wound care at home and will start doxycycline to cover for staph/strep.  Advised that the nails may come off in the setting of infection but should regrow without difficulty.  Patient to follow closely with PCP. No concern clinically for fracture. Imaging deferred.   At this time, I do not feel there is any life-threatening condition present. I have reviewed and discussed all results (EKG, imaging, lab, urine as  appropriate), exam findings with patient. I have reviewed nursing notes and appropriate previous records.  I feel the patient is safe to be discharged home without further emergent workup. Discussed usual and customary return precautions. Patient and family (if present) verbalize understanding and are comfortable with this plan.  Patient will follow-up with their primary care provider. If they do not have a primary care provider, information for follow-up has been provided to them. All questions have been answered.  ____________________________________________  FINAL CLINICAL IMPRESSION(S) / ED DIAGNOSES  Final diagnoses:  Finger pain, left    NEW OUTPATIENT MEDICATIONS STARTED DURING THIS VISIT:  Discharge Medication List as of 06/06/2018  2:36 AM    START taking these medications   Details  doxycycline (VIBRAMYCIN) 100 MG capsule Take 1 capsule (100 mg total) by mouth 2 (two) times daily for 7 days., Starting Thu 06/06/2018, Until Thu 06/13/2018, Print        Note:  This document was prepared using Dragon voice recognition software and may include unintentional dictation errors.  Nanda Quinton, MD Emergency Medicine    Nadav Swindell, [facility], MD 06/06/18 [phone]

## 2018-06-06 NOTE — ED Triage Notes (Signed)
Pt states her fifth and fourth finger on her left hand are infected and draining out from under the nail beds

## 2018-06-11 ENCOUNTER — Telehealth: Payer: Self-pay | Admitting: Certified Nurse Midwife

## 2018-06-11 NOTE — Telephone Encounter (Signed)
Spoke with patient. Reports vaginal odor and d/c prior to menses. LMP 06/09/18. Used boric acid vaginal suppository on 7/5, only used x1 due to menses. Hx of recurrent BV, last tx with flagyl po on 04/23/18 prior to colpo.   Patient requesting RX for BV. Advised patient OV recommended for further evaluation, patient declined. Patient states she has not had a cycle in awhile, is unsure when menses will stop, requesting recommendations for now. Advised will review with Melvia Heaps, CNM and return call.  Melvia Heaps, CNM -please advise.

## 2018-06-11 NOTE — Telephone Encounter (Signed)
Patient is requesting a prescription be called in for bacterial vaginosis. Says she would come in, but she started her cycle. Last seen for annual exam on 04/12/18.

## 2018-06-12 NOTE — Telephone Encounter (Signed)
Spoke with patient, advised as seen below per Deborah Leonard, CNM. Patient verbalizes understanding and is agreeable.   Encounter closed.  

## 2018-06-12 NOTE — Telephone Encounter (Signed)
Finish period which will change the vaginal ph and this should resolve the issues. If symptoms present after period can do Aveeno sitz bath of odor, Should resolve. If not will need OV. She is not on birthcontrol now.

## 2018-06-14 ENCOUNTER — Encounter (HOSPITAL_BASED_OUTPATIENT_CLINIC_OR_DEPARTMENT_OTHER): Payer: Self-pay | Admitting: *Deleted

## 2018-06-14 ENCOUNTER — Emergency Department (HOSPITAL_BASED_OUTPATIENT_CLINIC_OR_DEPARTMENT_OTHER)
Admission: EM | Admit: 2018-06-14 | Discharge: 2018-06-15 | Disposition: A | Discharge status: Home / Self Care | Payer: 59 | Attending: Emergency Medicine | Admitting: Emergency Medicine

## 2018-06-14 ENCOUNTER — Other Ambulatory Visit: Payer: Self-pay

## 2018-06-14 DIAGNOSIS — I10 Essential (primary) hypertension: Secondary | ICD-10-CM | POA: Insufficient documentation

## 2018-06-14 DIAGNOSIS — S199XXA Unspecified injury of neck, initial encounter: Secondary | ICD-10-CM | POA: Diagnosis present

## 2018-06-14 DIAGNOSIS — S161XXA Strain of muscle, fascia and tendon at neck level, initial encounter: Secondary | ICD-10-CM | POA: Diagnosis not present

## 2018-06-14 DIAGNOSIS — Y92410 Unspecified street and highway as the place of occurrence of the external cause: Secondary | ICD-10-CM | POA: Insufficient documentation

## 2018-06-14 DIAGNOSIS — Z79899 Other long term (current) drug therapy: Secondary | ICD-10-CM | POA: Insufficient documentation

## 2018-06-14 DIAGNOSIS — Z87891 Personal history of nicotine dependence: Secondary | ICD-10-CM | POA: Insufficient documentation

## 2018-06-14 DIAGNOSIS — Y998 Other external cause status: Secondary | ICD-10-CM | POA: Insufficient documentation

## 2018-06-14 DIAGNOSIS — Y9389 Activity, other specified: Secondary | ICD-10-CM | POA: Insufficient documentation
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
## 2018-06-15 ENCOUNTER — Emergency Department (HOSPITAL_BASED_OUTPATIENT_CLINIC_OR_DEPARTMENT_OTHER): Payer: 59

## 2018-06-15 MED ORDER — NAPROXEN 375 MG PO TABS: ORAL_TABLET | ORAL | 0 refills | Status: DC

## 2018-06-15 MED ORDER — NAPROXEN 250 MG PO TABS
500.0000 mg | ORAL_TABLET | Freq: Once | ORAL | Status: AC
  Administered 2018-06-15: 500 mg via ORAL
  Filled 2018-06-15: qty 2
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
## 2019-01-03 ENCOUNTER — Other Ambulatory Visit: Payer: Self-pay

## 2019-01-03 ENCOUNTER — Emergency Department (HOSPITAL_BASED_OUTPATIENT_CLINIC_OR_DEPARTMENT_OTHER)
Admission: EM | Admit: 2019-01-03 | Discharge: 2019-01-03 | Disposition: A | Discharge status: Home / Self Care | Payer: 59 | Attending: Emergency Medicine | Admitting: Emergency Medicine

## 2019-01-03 ENCOUNTER — Encounter (HOSPITAL_BASED_OUTPATIENT_CLINIC_OR_DEPARTMENT_OTHER): Payer: Self-pay | Admitting: Emergency Medicine

## 2019-01-03 DIAGNOSIS — I10 Essential (primary) hypertension: Secondary | ICD-10-CM | POA: Insufficient documentation

## 2019-01-03 DIAGNOSIS — E119 Type 2 diabetes mellitus without complications: Secondary | ICD-10-CM | POA: Diagnosis not present

## 2019-01-03 DIAGNOSIS — J209 Acute bronchitis, unspecified: Secondary | ICD-10-CM | POA: Diagnosis not present

## 2019-01-03 DIAGNOSIS — Z87891 Personal history of nicotine dependence: Secondary | ICD-10-CM | POA: Diagnosis not present

## 2019-01-03 DIAGNOSIS — Z79899 Other long term (current) drug therapy: Secondary | ICD-10-CM | POA: Diagnosis not present

## 2019-01-03 DIAGNOSIS — R05 Cough: Secondary | ICD-10-CM | POA: Diagnosis present

## 2019-01-03 MED ORDER — BENZONATATE 100 MG PO CAPS
100.0000 mg | ORAL_CAPSULE | Freq: Once | ORAL | Status: AC
  Administered 2019-01-03: 100 mg via ORAL

## 2019-01-03 MED ORDER — HYDROCODONE-HOMATROPINE 5-1.5 MG/5ML PO SYRP
5.0000 mL | ORAL_SOLUTION | Freq: Once | ORAL | Status: DC
  Filled 2019-01-03: qty 5

## 2019-01-03 MED ORDER — PREDNISONE 20 MG PO TABS: 40.0000 mg | ORAL_TABLET | Freq: Every day | ORAL | 0 refills | Status: DC

## 2019-01-03 MED ORDER — BENZONATATE 100 MG PO CAPS
ORAL_CAPSULE | ORAL | Status: AC
  Filled 2019-01-03: qty 1

## 2019-01-03 MED ORDER — BENZONATATE 200 MG PO CAPS: 200.0000 mg | ORAL_CAPSULE | Freq: Three times a day (TID) | ORAL | 0 refills | Status: DC | PRN

## 2019-01-03 MED ORDER — ALBUTEROL SULFATE HFA 108 (90 BASE) MCG/ACT IN AERS: 2.0000 | INHALATION_SPRAY | RESPIRATORY_TRACT | 2 refills | Status: AC | PRN

## 2019-01-03 MED ORDER — AZITHROMYCIN 250 MG PO TABS: 250.0000 mg | ORAL_TABLET | Freq: Every day | ORAL | 0 refills | Status: DC

## 2019-01-03 NOTE — ED Provider Notes (Signed)
[facility] EMERGENCY DEPARTMENT Provider Note   CSN: [id number] Arrival date & time: 01/03/19  6168     History   Chief Complaint Chief Complaint  Patient presents with  . Cough    HPI Lindsey Ruiz is a 38 y.o. female.  Patient has had cough, chest congestion for more than a week.  She does have a history of recurrent bronchitis requiring bronchodilator therapy in the past.  She has not been using her inhaler for this.  Patient reports cough that is productive of thick sputum.  She has not noticed any fever.     Past Medical History:  Diagnosis Date  . Abnormal Pap smear of cervix    01-05-17 neg HPV HR + 16,18/45 neg  . Anxiety   . Hypertension   . Migraine     Patient Active Problem List   Diagnosis Date Noted  . BRCA2 gene mutation negative 12/19/2017  . Herpes simplex 12/19/2017  . Menorrhagia 12/19/2017  . Type 2 diabetes mellitus ([place]) 12/19/2017  . Vitamin D deficiency 12/19/2017  . Breast mass 11/20/2017  . Family history of breast cancer in mother 11/20/2017  . Mastodynia of right breast 01/18/2017  . Status post right foot surgery 11/25/2015  . Tenosynovitis of right foot 11/11/2015  . 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 Pain in lower limb 10/22/2015  . Migraine 04/09/2015  . Essential hypertension 04/09/2015  . 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 Panic attack 09/28/2014  . Anxiety 09/28/2014  . Chronic back pain 09/28/2014    Past Surgical History:  Procedure Laterality Date  . breast cyst    . BREAST LUMPECTOMY  11/22/2017  . BUNIONECTOMY    . Cotton Osteotomy w/ Bone Graft Right 11/19/2015   RT FOOT  . Cotton Osteotomy w/ Bone Graft Left 06/02/2016   LT FOOT  . nexplanon insertion     inserted 09-06-15  . 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 TONSILLECTOMY AND ADENOIDECTOMY       OB History    Gravida  5   Para  2   Term  2   Preterm      AB  3   Living  2     SAB  1   TAB  2   Ectopic      Multiple      Live Births               Home Medications    Prior to Admission medications   Medication Sig Start Date End Date Taking? Authorizing Provider  cetirizine (ZYRTEC) 10 MG tablet Take by mouth. 09/28/16  Yes [provider]  clonazePAM (KLONOPIN) 0.5 MG tablet Take by mouth as needed.  09/24/16  Yes [provider]  oxyCODONE-acetaminophen (PERCOCET) 7.5-325 MG tablet Take 1 tablet by mouth every 8 (eight) hours as needed. 03/05/18  Yes Sheard, Myeong O, DPM  PROAIR HFA 108 (90 Base) MCG/ACT inhaler INHALE 2 PUFFS TID PRN FOR WHEEZING 12/06/16  Yes [provider]  albuterol (PROVENTIL HFA;VENTOLIN HFA) 108 (90 Base) MCG/ACT inhaler Inhale 2 puffs into the lungs every 4 (four) hours as needed for wheezing or shortness of breath. 01/03/19   Orpah Greek, MD  azithromycin (ZITHROMAX) 250 MG tablet Take 1 tablet (250 mg total) by mouth daily. Take first 2 tablets together, then 1 every day until finished. 01/03/19   Orpah Greek, MD  benzonatate (TESSALON) 200 MG capsule Take 1 capsule (200 mg total) by mouth 3 (three) times daily as needed for cough. 01/03/19   Orpah Greek, MD  FOLIC ACID PO Take by mouth.    [provider]  predniSONE (DELTASONE) 20 MG tablet Take 2 tablets (40 mg total) by mouth daily with breakfast. 01/03/19   , Gwenyth Allegra, MD  UNABLE TO FIND cbd oil    [provider]    Family History Family History  Problem Relation Age of Onset  . Breast cancer Mother   . Lung cancer Paternal Aunt   . Diabetes Maternal Grandmother   . Hypertension Maternal Grandfather   . Heart disease Maternal Grandfather        chf  . Esophageal cancer Paternal Grandmother   . Hypertension Paternal Grandfather   . Heart attack Paternal Grandfather   . Diabetes Paternal Aunt   . Stroke Father     Social History Social  History   Tobacco Use  . Smoking status: Former Research scientist (life sciences)  . Smokeless tobacco: Never Used  Substance Use Topics  . Alcohol use: No    Alcohol/week: 0.0 standard drinks  . Drug use: No     Allergies   Kenalog [triamcinolone acetonide]; Pineapple; Penicillins; Sulfa antibiotics; and Sumatriptan   Review of Systems Review of Systems  Respiratory: Positive for cough and shortness of breath.   All other systems reviewed and are negative.    Physical Exam Updated Vital Signs BP (!) 142/99 (BP Location: Left Arm)   Pulse 93   Temp 98.5 F (36.9 C)   Resp 18   Ht '5\' 7"'  (1.702 m)   Wt 83.5 kg   SpO2 98%   BMI 28.82 kg/m   Physical Exam Vitals signs and nursing note reviewed.  Constitutional:      General: She is not in acute distress.    Appearance: Normal appearance. She is well-developed.  HENT:     Head: Normocephalic and atraumatic.     Right Ear: Hearing normal.     Left Ear: Hearing normal.     Nose: Nose normal.  Eyes:     Conjunctiva/sclera: Conjunctivae normal.     Pupils: Pupils are equal, round, and reactive to light.  Neck:     Musculoskeletal: Normal range of motion and neck supple.  Cardiovascular:     Rate and Rhythm: Regular rhythm.     Heart sounds: S1 normal and S2 normal. No murmur. No friction rub. No gallop.   Pulmonary:     Effort: Pulmonary effort is normal. No respiratory distress.     Breath sounds: Decreased air movement present. Decreased breath sounds present.  Chest:     Chest wall: No tenderness.  Abdominal:     General: Bowel sounds are normal.     Palpations: Abdomen is soft.     Tenderness: There is no abdominal tenderness. There is no guarding or rebound. Negative signs include Murphy's sign and McBurney's sign.     Hernia: No hernia is present.  Musculoskeletal: Normal range of motion.  Skin:    General: Skin is warm and dry.     Findings: No rash.  Neurological:     Mental Status: She is alert and oriented to person, place,  and time.     GCS: GCS eye subscore is 4. GCS verbal subscore is 5. GCS motor subscore is 6.     Cranial Nerves: No cranial nerve deficit.     Sensory: No sensory  deficit.     Coordination: Coordination normal.  Psychiatric:        Speech: Speech normal.        Behavior: Behavior normal.        Thought Content: Thought content normal.      ED Treatments / Results  Labs (all labs ordered are listed, but only abnormal results are displayed) Labs Reviewed - No data to display  EKG None  Radiology No results found.  Procedures Procedures (including critical care time)  Medications Ordered in ED Medications - No data to display   Initial Impression / Assessment and Plan / ED Course  I have reviewed the triage vital signs and the nursing notes.  Pertinent labs & imaging results that were available during my care of the patient were reviewed by me and considered in my medical decision making (see chart for details).     With cough and chest congestion for more than a week.  She has a history of bronchospasm with upper respiratory infections in the past.  She is not moving air well currently but no obvious wheezing.  Oxygenation is normal and she is not in any distress.  Final Clinical Impressions(s) / ED Diagnoses   Final diagnoses:  Acute bronchitis, unspecified organism    ED Discharge Orders         Ordered    predniSONE (DELTASONE) 20 MG tablet  Daily with breakfast     01/03/19 0721    benzonatate (TESSALON) 200 MG capsule  3 times daily PRN     01/03/19 0721    albuterol (PROVENTIL HFA;VENTOLIN HFA) 108 (90 Base) MCG/ACT inhaler  Every 4 hours PRN     01/03/19 0721    azithromycin (ZITHROMAX) 250 MG tablet  Daily     01/03/19 0721           Orpah Greek, MD 01/03/19 [phone]

## 2019-01-03 NOTE — ED Triage Notes (Signed)
Pt c/o cough and body aches x 1 week.

## 2019-04-09 ENCOUNTER — Telehealth: Payer: Self-pay | Admitting: Obstetrics and Gynecology

## 2019-04-09 NOTE — Telephone Encounter (Signed)
Call returned to patient. Advised as seen below per Dr. Talbert Nan. OV cancelled for 04/14/19. Patient requesting recommendations for OB/GYN with midwifes, provided patient with [facility] contact information. Patient requesting RX for PNV, advised patient no Rx needed, can get PNV OTC at any retail pharmacy. Patient verbalizes understanding.  Routing to provider for final review. Patient is agreeable to disposition. Will close encounter.

## 2019-04-09 NOTE — Telephone Encounter (Signed)
Given the current covid 19 crisis, I would have her just establish care with OB unless she is having any problems or concerns.

## 2019-04-09 NOTE — Telephone Encounter (Signed)
Patient is calling to report that she had a positive otc pregnancy test.

## 2019-04-09 NOTE — Telephone Encounter (Signed)
Spoke with patient. LMP approximately 03/14/19. Postive UPT x2 on 5/5 and x1 on 5/6. Not on any contraceptive. Denies pain or vaginal bleeding. No Hx of gonorrhea or chlamydia. Requesting OV for pregnancy confirmation. OV scheduled for 5/11 at 11am with Dr. Talbert Nan. Advised patient to return call to office if experiencing pain and/or bleeding, go to [facility] if after hours. Advised provider will review, our office will return call if any additional recommendations.   Last AEX 04/12/18 DL  Routing to provider for final review. Patient is agreeable to disposition. Will close encounter.

## 2019-04-14 ENCOUNTER — Ambulatory Visit: Payer: Self-pay | Admitting: Obstetrics and Gynecology

## 2019-04-16 ENCOUNTER — Encounter (HOSPITAL_BASED_OUTPATIENT_CLINIC_OR_DEPARTMENT_OTHER): Payer: Self-pay | Admitting: *Deleted

## 2019-04-16 ENCOUNTER — Emergency Department (HOSPITAL_COMMUNITY): Payer: 59

## 2019-04-16 ENCOUNTER — Emergency Department (HOSPITAL_BASED_OUTPATIENT_CLINIC_OR_DEPARTMENT_OTHER): Payer: 59

## 2019-04-16 ENCOUNTER — Emergency Department (HOSPITAL_BASED_OUTPATIENT_CLINIC_OR_DEPARTMENT_OTHER)
Admission: EM | Admit: 2019-04-16 | Discharge: 2019-04-16 | Disposition: A | Discharge status: Home / Self Care | Payer: 59 | Attending: Emergency Medicine | Admitting: Emergency Medicine

## 2019-04-16 ENCOUNTER — Other Ambulatory Visit: Payer: Self-pay

## 2019-04-16 DIAGNOSIS — Z3A01 Less than 8 weeks gestation of pregnancy: Secondary | ICD-10-CM | POA: Insufficient documentation

## 2019-04-16 DIAGNOSIS — O23599 Infection of other part of genital tract in pregnancy, unspecified trimester: Secondary | ICD-10-CM | POA: Diagnosis not present

## 2019-04-16 DIAGNOSIS — O9989 Other specified diseases and conditions complicating pregnancy, childbirth and the puerperium: Secondary | ICD-10-CM | POA: Diagnosis present

## 2019-04-16 DIAGNOSIS — B9689 Other specified bacterial agents as the cause of diseases classified elsewhere: Secondary | ICD-10-CM | POA: Insufficient documentation

## 2019-04-16 DIAGNOSIS — E119 Type 2 diabetes mellitus without complications: Secondary | ICD-10-CM | POA: Diagnosis not present

## 2019-04-16 DIAGNOSIS — I10 Essential (primary) hypertension: Secondary | ICD-10-CM | POA: Diagnosis not present

## 2019-04-16 DIAGNOSIS — R1031 Right lower quadrant pain: Secondary | ICD-10-CM | POA: Diagnosis not present

## 2019-04-16 DIAGNOSIS — Z79899 Other long term (current) drug therapy: Secondary | ICD-10-CM | POA: Diagnosis not present

## 2019-04-16 LAB — CBC WITH DIFFERENTIAL/PLATELET
Abs Immature Granulocytes: 0.04 10*3/uL (ref 0.00–0.07)
Basophils Absolute: 0 10*3/uL (ref 0.0–0.1)
Basophils Relative: 1 %
Eosinophils Absolute: 0.1 10*3/uL (ref 0.0–0.5)
Eosinophils Relative: 1 %
HCT: 38 % (ref 36.0–46.0)
Hemoglobin: 12.3 g/dL (ref 12.0–15.0)
Immature Granulocytes: 1 %
Lymphocytes Relative: 39 %
Lymphs Abs: 3 10*3/uL (ref 0.7–4.0)
MCH: 23.2 pg — ABNORMAL LOW (ref 26.0–34.0)
MCHC: 32.4 g/dL (ref 30.0–36.0)
MCV: 71.7 fL — ABNORMAL LOW (ref 80.0–100.0)
Monocytes Absolute: 0.6 10*3/uL (ref 0.1–1.0)
Monocytes Relative: 8 %
Neutro Abs: 3.9 10*3/uL (ref 1.7–7.7)
Neutrophils Relative %: 50 %
Platelets: 265 10*3/uL (ref 150–400)
RBC: 5.3 MIL/uL — ABNORMAL HIGH (ref 3.87–5.11)
RDW: 14.8 % (ref 11.5–15.5)
Smear Review: NORMAL
WBC: 7.7 10*3/uL (ref 4.0–10.5)
nRBC: 0 % (ref 0.0–0.2)

## 2019-04-16 LAB — COMPREHENSIVE METABOLIC PANEL
ALT: 17 U/L (ref 0–44)
AST: 18 U/L (ref 15–41)
Albumin: 4.3 g/dL (ref 3.5–5.0)
Alkaline Phosphatase: 73 U/L (ref 38–126)
Anion gap: 7 (ref 5–15)
BUN: 9 mg/dL (ref 6–20)
CO2: 21 mmol/L — ABNORMAL LOW (ref 22–32)
Calcium: 9.1 mg/dL (ref 8.9–10.3)
Chloride: 107 mmol/L (ref 98–111)
Creatinine, Ser: 0.64 mg/dL (ref 0.44–1.00)
GFR calc Af Amer: >60 mL/min (ref >60)
GFR calc non Af Amer: >60 mL/min (ref >60)
Glucose, Bld: 93 mg/dL (ref 70–99)
Potassium: 4.1 mmol/L (ref 3.5–5.1)
Sodium: 135 mmol/L (ref 135–145)
Total Bilirubin: 0.3 mg/dL (ref 0.3–1.2)
Total Protein: 7.3 g/dL (ref 6.5–8.1)

## 2019-04-16 LAB — URINALYSIS, ROUTINE W REFLEX MICROSCOPIC
Bilirubin Urine: NEGATIVE
Glucose, UA: NEGATIVE mg/dL
Hgb urine dipstick: NEGATIVE
Ketones, ur: NEGATIVE mg/dL
Leukocytes,Ua: NEGATIVE
Nitrite: NEGATIVE
Protein, ur: NEGATIVE mg/dL
Specific Gravity, Urine: <1.005 — ABNORMAL LOW (ref 1.005–1.030)
pH: 6.5 (ref 5.0–8.0)

## 2019-04-16 LAB — WET PREP, GENITAL
Sperm: NONE SEEN
Trich, Wet Prep: NONE SEEN
Yeast Wet Prep HPF POC: NONE SEEN

## 2019-04-16 LAB — HCG, QUANTITATIVE, PREGNANCY: hCG, Beta Chain, Quant, S: 8469 m[IU]/mL — ABNORMAL HIGH (ref <5)

## 2019-04-16 LAB — LIPASE, BLOOD: Lipase: 24 U/L (ref 11–51)

## 2019-04-16 MED ORDER — METRONIDAZOLE 500 MG PO TABS: 500.0000 mg | ORAL_TABLET | Freq: Two times a day (BID) | ORAL | 0 refills | Status: AC

## 2019-04-16 MED ORDER — ACETAMINOPHEN 325 MG PO TABS
650.0000 mg | ORAL_TABLET | Freq: Once | ORAL | Status: AC
  Administered 2019-04-16: 21:00:00 650 mg via ORAL
  Filled 2019-04-16: qty 2

## 2019-04-16 NOTE — Discharge Instructions (Addendum)
You were seen in the ED today for lower abdominal pain without any findings on your ultrasound. You were found to have bacterial vaginosis; I have sent Flagyl to your pharmacy for you to pick up after you have gotten your MRI.   Please follow up with your primary doctor within the next 2-3 days.  If you do not have a primary care provider, information for a healthcare clinic has been provided for you to make arrangements for follow up care. Please return to the ER sooner if you have any new or worsening symptoms, or if you have any of the following symptoms:  Abdominal pain that does not go away.  You have a fever.  You keep throwing up (vomiting).  The pain is felt only in portions of the abdomen. Pain in the right side could possibly be appendicitis. In an adult, pain in the left lower portion of the abdomen could be colitis or diverticulitis.  You pass bloody or black tarry stools.  There is bright red blood in the stool.  The constipation stays for more than 4 days.  There is belly (abdominal) or rectal pain.  You do not seem to be getting better.  You have any questions or concerns.

## 2019-04-16 NOTE — ED Triage Notes (Signed)
Pt c/o right lower abd pain x 1 day 4 weeks preg

## 2019-04-16 NOTE — ED Provider Notes (Signed)
Assumed care of patient after she was transferred for med center [facility].  Briefly patient is currently 5W 3 days pregnant by ultrasound, presented to the ED complaining of right lower quadrant abdominal pain.  There was concern for possible appendicitis therefore patient was transferred to [facility] for evaluation appendicitis by MRI.  Physical Exam  BP (!) 138/97   Pulse (!) 101   Temp 98.2 F (36.8 C)   Resp 18   Ht 5\' 7"  (1.702 m)   Wt 85.3 kg   LMP 03/14/2019 (Exact Date)   SpO2 100%   BMI 29.44 kg/m   Physical Exam Vitals signs and nursing note reviewed.  Constitutional:      General: She is not in acute distress.    Appearance: She is well-developed.  HENT:     Head: Normocephalic and atraumatic.  Eyes:     Conjunctiva/sclera: Conjunctivae normal.  Neck:     Musculoskeletal: Neck supple.  Cardiovascular:     Rate and Rhythm: Normal rate and regular rhythm.     Heart sounds: No murmur.  Pulmonary:     Effort: Pulmonary effort is normal. No respiratory distress.     Breath sounds: Normal breath sounds.  Abdominal:     Palpations: Abdomen is soft.  Skin:    General: Skin is warm and dry.  Neurological:     Mental Status: She is alert.     ED Course/Procedures     Procedures  Results for orders placed or performed during the hospital encounter of 04/16/19  Wet prep, genital  Result Value Ref Range   Yeast Wet Prep HPF POC NONE SEEN NONE SEEN   Trich, Wet Prep NONE SEEN NONE SEEN   Clue Cells Wet Prep HPF POC PRESENT (A) NONE SEEN   WBC, Wet Prep HPF POC MANY (A) NONE SEEN   Sperm NONE SEEN   Urinalysis, Routine w reflex microscopic  Result Value Ref Range   Color, Urine YELLOW YELLOW   APPearance CLEAR CLEAR   Specific Gravity, Urine <1.005 (L) 1.005 - 1.030   pH 6.5 5.0 - 8.0   Glucose, UA NEGATIVE NEGATIVE mg/dL   Hgb urine dipstick NEGATIVE NEGATIVE   Bilirubin Urine NEGATIVE NEGATIVE   Ketones, ur NEGATIVE NEGATIVE mg/dL   Protein, ur  NEGATIVE NEGATIVE mg/dL   Nitrite NEGATIVE NEGATIVE   Leukocytes,Ua NEGATIVE NEGATIVE  Comprehensive metabolic panel  Result Value Ref Range   Sodium 135 135 - 145 mmol/L   Potassium 4.1 3.5 - 5.1 mmol/L   Chloride 107 98 - 111 mmol/L   CO2 21 (L) 22 - 32 mmol/L   Glucose, Bld 93 70 - 99 mg/dL   BUN 9 6 - 20 mg/dL   Creatinine, Ser 0.64 0.44 - 1.00 mg/dL   Calcium 9.1 8.9 - 10.3 mg/dL   Total Protein 7.3 6.5 - 8.1 g/dL   Albumin 4.3 3.5 - 5.0 g/dL   AST 18 15 - 41 U/L   ALT 17 0 - 44 U/L   Alkaline Phosphatase 73 38 - 126 U/L   Total Bilirubin 0.3 0.3 - 1.2 mg/dL   GFR calc non Af Amer >60 >60 mL/min   GFR calc Af Amer >60 >60 mL/min   Anion gap 7 5 - 15  Lipase, blood  Result Value Ref Range   Lipase 24 11 - 51 U/L  CBC with Differential  Result Value Ref Range   WBC 7.7 4.0 - 10.5 K/uL   RBC 5.30 (H) 3.87 -  5.11 MIL/uL   Hemoglobin 12.3 12.0 - 15.0 g/dL   HCT 38.0 36.0 - 46.0 %   MCV 71.7 (L) 80.0 - 100.0 fL   MCH 23.2 (L) 26.0 - 34.0 pg   MCHC 32.4 30.0 - 36.0 g/dL   RDW 14.8 11.5 - 15.5 %   Platelets 265 150 - 400 K/uL   nRBC 0.0 0.0 - 0.2 %   Neutrophils Relative % 50 %   Neutro Abs 3.9 1.7 - 7.7 K/uL   Lymphocytes Relative 39 %   Lymphs Abs 3.0 0.7 - 4.0 K/uL   Monocytes Relative 8 %   Monocytes Absolute 0.6 0.1 - 1.0 K/uL   Eosinophils Relative 1 %   Eosinophils Absolute 0.1 0.0 - 0.5 K/uL   Basophils Relative 1 %   Basophils Absolute 0.0 0.0 - 0.1 K/uL   WBC Morphology VACUOLATED NEUTROPHILS    Smear Review Normal platelet morphology    Immature Granulocytes 1 %   Abs Immature Granulocytes 0.04 0.00 - 0.07 K/uL   Target Cells PRESENT   hCG, quantitative, pregnancy  Result Value Ref Range   hCG, Beta Chain, Quant, S 8,469 (H) <5 mIU/mL   US Ob Comp < 14 Wks  Result Date: 04/16/2019 CLINICAL DATA:  Right lower quadrant pain for 1 day. Positive home pregnancy test. EXAM: OBSTETRIC <14 WK [place] AND TRANSVAGINAL OB US DOPPLER ULTRASOUND OF OVARIES TECHNIQUE:  Both transabdominal and transvaginal ultrasound examinations were performed for complete evaluation of the gestation as well as the maternal uterus, adnexal regions, and pelvic cul-de-sac. Transvaginal technique was performed to assess early pregnancy. Color and duplex Doppler ultrasound was utilized to evaluate blood flow to the ovaries. COMPARISON:  None. FINDINGS: Intrauterine gestational sac: Single visualized. Single uterine fibroid measuring 2.1 cm noted. Yolk sac:  Visualized. Embryo:  Not visualized. Cardiac Activity: Not applicable. MSD: 7.1 mm   5 w   3 d Subchorionic hemorrhage:  None visualized. Maternal uterus/adnexae: Corpus luteum cyst on the right noted. Trace amount of free pelvic fluid is seen. Pulsed Doppler evaluation of both ovaries demonstrates normal appearing low-resistance arterial and venous waveforms. IMPRESSION: No acute abnormality. Findings consistent with an early intrauterine pregnancy. 2.1 cm uterine fibroid. Electronically Signed   By: Inge Rise M.D.   On: 04/16/2019 16:14   US Ob Transvaginal  Result Date: 04/16/2019 CLINICAL DATA:  Right lower quadrant pain for 1 day. Positive home pregnancy test. EXAM: OBSTETRIC <14 WK [place] AND TRANSVAGINAL OB US DOPPLER ULTRASOUND OF OVARIES TECHNIQUE: Both transabdominal and transvaginal ultrasound examinations were performed for complete evaluation of the gestation as well as the maternal uterus, adnexal regions, and pelvic cul-de-sac. Transvaginal technique was performed to assess early pregnancy. Color and duplex Doppler ultrasound was utilized to evaluate blood flow to the ovaries. COMPARISON:  None. FINDINGS: Intrauterine gestational sac: Single visualized. Single uterine fibroid measuring 2.1 cm noted. Yolk sac:  Visualized. Embryo:  Not visualized. Cardiac Activity: Not applicable. MSD: 7.1 mm   5 w   3 d Subchorionic hemorrhage:  None visualized. Maternal uterus/adnexae: Corpus luteum cyst on the right noted. Trace amount of  free pelvic fluid is seen. Pulsed Doppler evaluation of both ovaries demonstrates normal appearing low-resistance arterial and venous waveforms. IMPRESSION: No acute abnormality. Findings consistent with an early intrauterine pregnancy. 2.1 cm uterine fibroid. Electronically Signed   By: Inge Rise M.D.   On: 04/16/2019 16:14   [place] Art/ven Flow Abd Pelv Doppler  Result Date: 04/16/2019 CLINICAL DATA:  Right lower quadrant  pain for 1 day. Positive home pregnancy test. EXAM: OBSTETRIC <14 WK [place] AND TRANSVAGINAL OB US DOPPLER ULTRASOUND OF OVARIES TECHNIQUE: Both transabdominal and transvaginal ultrasound examinations were performed for complete evaluation of the gestation as well as the maternal uterus, adnexal regions, and pelvic cul-de-sac. Transvaginal technique was performed to assess early pregnancy. Color and duplex Doppler ultrasound was utilized to evaluate blood flow to the ovaries. COMPARISON:  None. FINDINGS: Intrauterine gestational sac: Single visualized. Single uterine fibroid measuring 2.1 cm noted. Yolk sac:  Visualized. Embryo:  Not visualized. Cardiac Activity: Not applicable. MSD: 7.1 mm   5 w   3 d Subchorionic hemorrhage:  None visualized. Maternal uterus/adnexae: Corpus luteum cyst on the right noted. Trace amount of free pelvic fluid is seen. Pulsed Doppler evaluation of both ovaries demonstrates normal appearing low-resistance arterial and venous waveforms. IMPRESSION: No acute abnormality. Findings consistent with an early intrauterine pregnancy. 2.1 cm uterine fibroid. Electronically Signed   By: Inge Rise M.D.   On: 04/16/2019 16:14    MDM   Briefly, pt presenting to [facility] today for eval of RLQ abd pain onset today. No other associated sxs. No fevers.  Patient transferred to [facility] for rule out appendicitis with MRI.  On my exam patient has normoactive bowel sounds.  She does have some tenderness to the right lower quadrant as well as tenderness to the and RUQ. No  rebound, rigidity, or guarding.  CBC is without leukocytosis.  CMP shows normal kidney function, liver enzymes, bilirubin.  Normal electrolytes.  Lipase normal.  UA without evidence of urinary tract infection.  Wet prep was suggestive of BV and patient will be given Rx for Flagyl.  She denies concern for STD therefore prophylactic treatment deferred.  I was informed by radiology that pts official MRI read with be available tomorrow, but that there was no evidence of appendicitis.   9:59 PM Discussed MRI results with Dr. Joelyn Oms with radiology who stated that the patient had no other acute abnormalities in the abd/pelvis that she observed. States that there was no evidence of gallstones or inflammatory changes to the gallbladder.   Patient is still complaining of pain mostly to the right upper quadrant. She has no guarding or rebound tenderness.  I discussed the polyps ability of cholecystitis and I offered and recommended an ultrasound however patient states she is very tired and wants to go home.  Discussed risks and benefits of doing so.  She prefers to be discharged.  I advised her to take Tylenol for symptoms and stick to a bland diet.  Advised her to monitor symptoms closely and to return to the ER for any new or worsening symptoms.  She agrees to do so voices understanding of this plan.  All questions were answered.   Rodney Booze, PA-C 04/16/19 2216    Sherwood Gambler, MD 04/17/19 1655

## 2019-04-16 NOTE — ED Provider Notes (Signed)
[facility] Provider Note   CSN: [id number] Arrival date & time: 04/16/19  1314    History   Chief Complaint Chief Complaint  Patient presents with   Abdominal Pain    HPI Lindsey Ruiz is a 38 y.o. female who is G5P3013 and currently [redacted] weeks pregnant presents to the ED complaining of sudden onset, constant, sharp, 10/10, RLQ abdominal pain that began this morning. Pt had a positive at home pregnancy test last week and then was seen by [facility] with 2 beta HCG levels that confirmed positive pregnancy. Pt has not taken anything for the pain. There are no exacerbating or alleviating factors. Denies fever, chills, nausea, vomiting, diarrhea, constipation, vaginal bleeding, pelvic pain, vaginal discharge, dysuria, frequency, or any other associated symptoms. No previous abdominal surgeries. LMP 03/14/2019.        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   Pronation deformity of ankle, 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   Pain in lower limb 10/22/2015   Migraine 04/09/2015   Essential hypertension 04/09/2015   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 09/30/2014   Panic attack 09/28/2014   Anxiety 09/28/2014   Chronic back pain 09/28/2014    Past Surgical History:  Procedure Laterality Date   breast cyst     BREAST LUMPECTOMY  11/22/2017    BUNIONECTOMY     [place] Osteotomy w/ Bone Graft Right 11/19/2015   RT FOOT   [place]ton Osteotomy w/ Bone Graft Left 06/02/2016   LT FOOT   nexplanon insertion     inserted 09-06-15   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 Authorizing Provider  albuterol (PROVENTIL HFA;VENTOLIN HFA) 108 (90 Base) MCG/ACT inhaler Inhale 2 puffs into the lungs every 4 (four) hours as needed for wheezing or shortness of breath. 01/03/19   Orpah Greek, MD  cetirizine (ZYRTEC) 10 MG tablet Take by mouth. 09/28/16   [provider]  clonazePAM (KLONOPIN) 0.5 MG tablet Take by mouth as needed.  09/24/16   [provider]  FOLIC ACID PO Take by mouth.    [provider]  metroNIDAZOLE (FLAGYL) 500 MG tablet Take 1 tablet (500 mg total) by mouth 2 (two) times daily. 04/16/19   Eustaquio Maize, PA-C  oxyCODONE-acetaminophen (PERCOCET)  7.5-325 MG tablet Take 1 tablet by mouth every 8 (eight) hours as needed. 03/05/18   Sheard, Myeong O, DPM  PROAIR HFA 108 (90 Base) MCG/ACT inhaler INHALE 2 PUFFS TID PRN FOR WHEEZING 12/06/16   [provider]  UNABLE TO FIND cbd oil    [provider]    Family History Family History  Problem Relation Age of Onset   Breast cancer Mother    Lung cancer Paternal Aunt    Diabetes Maternal Grandmother    Hypertension Maternal Grandfather    Heart disease Maternal Grandfather        chf   Esophageal cancer Paternal Grandmother    Hypertension Paternal Grandfather    Heart attack Paternal Grandfather    Diabetes Paternal Aunt    Stroke Father     Social History Social History   Tobacco Use    Smoking status: Former Smoker   Smokeless tobacco: Never Used  Substance Use Topics   Alcohol use: No    Alcohol/week: 0.0 standard drinks   Drug use: No     Allergies   Kenalog [triamcinolone acetonide]; Pineapple; Penicillins; Sulfa antibiotics; and Sumatriptan   Review of Systems Review of Systems  Constitutional: Negative for chills and fever.  HENT: Negative for congestion.   Eyes: Negative for redness.  Respiratory: Negative for cough and shortness of breath.   Cardiovascular: Negative for chest pain.  Gastrointestinal: Positive for abdominal pain. Negative for blood in stool, constipation, diarrhea, nausea and vomiting.  Genitourinary: Negative for dysuria, frequency, pelvic pain, vaginal bleeding, vaginal discharge and vaginal pain.  Musculoskeletal: Negative for back pain.  Skin: Negative for rash.  Neurological: Negative for headaches.     Physical Exam Updated Vital Signs BP (!) 158/103    Pulse 89    Temp 98.2 F (36.8 C)    Resp 16    Ht '5\' 7"'  (1.702 m)    Wt 85.3 kg    LMP 03/14/2019    SpO2 99%    BMI 29.44 kg/m   Physical Exam Vitals signs and nursing note reviewed.  Constitutional:      Appearance: She is not ill-appearing.  HENT:     Head: Normocephalic and atraumatic.  Eyes:     Conjunctiva/sclera: Conjunctivae normal.  Neck:     Musculoskeletal: Neck supple.  Cardiovascular:     Rate and Rhythm: Normal rate and regular rhythm.     Heart sounds: Normal heart sounds. No murmur.  Pulmonary:     Effort: Pulmonary effort is normal.     Breath sounds: Normal breath sounds. No wheezing, rhonchi or rales.  Abdominal:     General: Abdomen is flat.     Palpations: Abdomen is soft.     Tenderness: There is abdominal tenderness in the right lower quadrant. There is no guarding or rebound. Positive signs include McBurney's sign. Negative signs include psoas sign and obturator sign.  Genitourinary:    Comments: Chaperone present; normal external  genitalia; small amount of thick white discharge in vaginal vault; Os is closed; no adnexal or cervical motion tenderness on bimanual exam Skin:    General: Skin is warm and dry.  Neurological:     Mental Status: She is alert.      ED Treatments / Results  Labs (all labs ordered are listed, but only abnormal results are displayed) Labs Reviewed  WET PREP, GENITAL - Abnormal; Notable for the following components:      Result Value   Clue Cells Wet Prep  HPF POC PRESENT (*)    WBC, Wet Prep HPF POC MANY (*)    All other components within normal limits  URINALYSIS, ROUTINE W REFLEX MICROSCOPIC - Abnormal; Notable for the following components:   Specific Gravity, Urine <1.005 (*)    All other components within normal limits  COMPREHENSIVE METABOLIC PANEL - Abnormal; Notable for the following components:   CO2 21 (*)    All other components within normal limits  CBC WITH DIFFERENTIAL/PLATELET - Abnormal; Notable for the following components:   RBC 5.30 (*)    MCV 71.7 (*)    MCH 23.2 (*)    All other components within normal limits  LIPASE, BLOOD  RPR  HIV ANTIBODY (ROUTINE TESTING W REFLEX)  HCG, QUANTITATIVE, PREGNANCY  GC/CHLAMYDIA PROBE AMP ([place]) NOT AT [facility]    EKG None  Radiology US Ob Comp < 14 Wks  Result Date: 04/16/2019 CLINICAL DATA:  Right lower quadrant pain for 1 day. Positive home pregnancy test. EXAM: OBSTETRIC <14 WK [place] AND TRANSVAGINAL OB US DOPPLER ULTRASOUND OF OVARIES TECHNIQUE: Both transabdominal and transvaginal ultrasound examinations were performed for complete evaluation of the gestation as well as the maternal uterus, adnexal regions, and pelvic cul-de-sac. Transvaginal technique was performed to assess early pregnancy. Color and duplex Doppler ultrasound was utilized to evaluate blood flow to the ovaries. COMPARISON:  None. FINDINGS: Intrauterine gestational sac: Single visualized. Single uterine fibroid measuring 2.1 cm noted. Yolk sac:   Visualized. Embryo:  Not visualized. Cardiac Activity: Not applicable. MSD: 7.1 mm   5 w   3 d Subchorionic hemorrhage:  None visualized. Maternal uterus/adnexae: Corpus luteum cyst on the right noted. Trace amount of free pelvic fluid is seen. Pulsed Doppler evaluation of both ovaries demonstrates normal appearing low-resistance arterial and venous waveforms. IMPRESSION: No acute abnormality. Findings consistent with an early intrauterine pregnancy. 2.1 cm uterine fibroid. Electronically Signed   By: Inge Rise M.D.   On: 04/16/2019 16:14   US Ob Transvaginal  Result Date: 04/16/2019 CLINICAL DATA:  Right lower quadrant pain for 1 day. Positive home pregnancy test. EXAM: OBSTETRIC <14 WK [place] AND TRANSVAGINAL OB US DOPPLER ULTRASOUND OF OVARIES TECHNIQUE: Both transabdominal and transvaginal ultrasound examinations were performed for complete evaluation of the gestation as well as the maternal uterus, adnexal regions, and pelvic cul-de-sac. Transvaginal technique was performed to assess early pregnancy. Color and duplex Doppler ultrasound was utilized to evaluate blood flow to the ovaries. COMPARISON:  None. FINDINGS: Intrauterine gestational sac: Single visualized. Single uterine fibroid measuring 2.1 cm noted. Yolk sac:  Visualized. Embryo:  Not visualized. Cardiac Activity: Not applicable. MSD: 7.1 mm   5 w   3 d Subchorionic hemorrhage:  None visualized. Maternal uterus/adnexae: Corpus luteum cyst on the right noted. Trace amount of free pelvic fluid is seen. Pulsed Doppler evaluation of both ovaries demonstrates normal appearing low-resistance arterial and venous waveforms. IMPRESSION: No acute abnormality. Findings consistent with an early intrauterine pregnancy. 2.1 cm uterine fibroid. Electronically Signed   By: Inge Rise M.D.   On: 04/16/2019 16:14   [place] Art/ven Flow Abd Pelv Doppler  Result Date: 04/16/2019 CLINICAL DATA:  Right lower quadrant pain for 1 day. Positive home pregnancy  test. EXAM: OBSTETRIC <14 WK [place] AND TRANSVAGINAL OB US DOPPLER ULTRASOUND OF OVARIES TECHNIQUE: Both transabdominal and transvaginal ultrasound examinations were performed for complete evaluation of the gestation as well as the maternal uterus, adnexal regions, and pelvic cul-de-sac. Transvaginal technique was performed to assess early pregnancy. Color and  duplex Doppler ultrasound was utilized to evaluate blood flow to the ovaries. COMPARISON:  None. FINDINGS: Intrauterine gestational sac: Single visualized. Single uterine fibroid measuring 2.1 cm noted. Yolk sac:  Visualized. Embryo:  Not visualized. Cardiac Activity: Not applicable. MSD: 7.1 mm   5 w   3 d Subchorionic hemorrhage:  None visualized. Maternal uterus/adnexae: Corpus luteum cyst on the right noted. Trace amount of free pelvic fluid is seen. Pulsed Doppler evaluation of both ovaries demonstrates normal appearing low-resistance arterial and venous waveforms. IMPRESSION: No acute abnormality. Findings consistent with an early intrauterine pregnancy. 2.1 cm uterine fibroid. Electronically Signed   By: Inge Rise M.D.   On: 04/16/2019 16:14    Procedures Procedures (including critical care time)  Medications Ordered in ED Medications - No data to display   Initial Impression / Assessment and Plan / ED Course  I have reviewed the triage vital signs and the nursing notes.  Pertinent labs & imaging results that were available during my care of the patient were reviewed by me and considered in my medical decision making (see chart for details).    Pt is a 38 year old female who is currently [redacted] weeks pregnant; presenting to the ED with RLQ abdominal pain that began today. Was seen by [facility] last week with repeat beta HCGs to confirm pregnancy; has not had ultrasound yet. Pt has RLQ tenderness on exam; no previous abdominal surgeries; concern for ectopic pregnancy vs torsion vs appendicitis. Pt afebrile in the ED today. Will get  ultrasound at this time with doppler flow given pt is pregnant and cannot obtain CT A/P. If ultrasound negative  will have discussion with patient concerning possible ED to ED transfer for abdominal MRI to rule out appendicitis. Baseline screening labs ordered as well as pelvic exam with STI panel. Will reevaluate once labs and imaging return.    CBC without leukocytosis to suggest infectious process. No electrolyte abnormalities. Negative lipase and normal LFTs. U/A without infection as well. Wet prep positive for clue cells; will treat patient outpatient with Flagyl. No tenderness to adnexa or cervix during bimanual exam; low suspicion for PID or TOA at this time. Still awaiting ultrasound results.   Ultrasound results with positive intrauterine pregnancy measuring 5 w 3 d; no obvious finding for pain. Attending physician Dr. Colvin Caroli evaluated patient and given rebound tenderness feel patient needs MRI today to rule out appendicitis; pt will drive herself to [facility] ED with Dr. Maryan Rued accepting. 7 day course of Flagyl prescribed for patient for her BV for her to pick up after she obtains MRI.        Final Clinical Impressions(s) / ED Diagnoses   Final diagnoses:  Bacterial vaginosis in pregnancy  Right lower quadrant abdominal pain    ED Discharge Orders         Ordered    metroNIDAZOLE (FLAGYL) 500 MG tablet  2 times daily     04/16/19 1657           Eustaquio Maize, PA-C 04/16/19 1726    Long, Wonda [facility], MD 04/16/19 2025

## 2019-04-16 NOTE — ED Notes (Signed)
MRI aware of pt arrival

## 2019-04-17 LAB — GC/CHLAMYDIA PROBE AMP (~~LOC~~) NOT AT ARMC
Chlamydia: NEGATIVE
Neisseria Gonorrhea: NEGATIVE

## 2019-04-17 LAB — HIV ANTIBODY (ROUTINE TESTING W REFLEX): HIV Screen 4th Generation wRfx: NONREACTIVE

## 2019-04-17 LAB — RPR: RPR Ser Ql: NONREACTIVE

## 2019-04-17 NOTE — ED Provider Notes (Addendum)
04/17/19 11:43 AM Attempted to contact the patient by telephone x2 to inform her of the final results of her MRI, however the patient did not answer and I was sent to voicemail. HIPPA compliant voicemail was left.    Rodney Booze, PA-C 04/17/19 [address] Tonnie Stillman S, PA-C 04/17/19 1715    Sherwood Gambler, MD 04/17/19 [phone]

## 2019-04-17 NOTE — ED Provider Notes (Addendum)
04/17/19 5:13 PM Attempted to contact the patient by telephone about MRI results and was sent to voicemail.    Rodney Booze, PA-C 04/17/19 1714    Rodney Booze, PA-C 04/17/19 1715    Sherwood Gambler, MD 04/17/19 [phone]

## 2019-04-22 ENCOUNTER — Ambulatory Visit: Payer: 59 | Admitting: Certified Nurse Midwife

## 2019-09-11 ENCOUNTER — Encounter: Payer: Self-pay | Admitting: Certified Nurse Midwife

## 2020-02-20 ENCOUNTER — Encounter: Payer: Self-pay | Admitting: Certified Nurse Midwife

## 2020-03-30 ENCOUNTER — Telehealth (HOSPITAL_COMMUNITY): Payer: Self-pay

## 2020-03-30 ENCOUNTER — Emergency Department (HOSPITAL_BASED_OUTPATIENT_CLINIC_OR_DEPARTMENT_OTHER)
Admission: EM | Admit: 2020-03-30 | Discharge: 2020-03-30 | Disposition: A | Discharge status: Home / Self Care | Payer: Medicaid Other | Attending: Emergency Medicine | Admitting: Emergency Medicine

## 2020-03-30 ENCOUNTER — Encounter (HOSPITAL_BASED_OUTPATIENT_CLINIC_OR_DEPARTMENT_OTHER): Payer: Self-pay | Admitting: Emergency Medicine

## 2020-03-30 ENCOUNTER — Other Ambulatory Visit: Payer: Self-pay

## 2020-03-30 ENCOUNTER — Emergency Department (HOSPITAL_BASED_OUTPATIENT_CLINIC_OR_DEPARTMENT_OTHER): Payer: Medicaid Other

## 2020-03-30 DIAGNOSIS — R519 Headache, unspecified: Secondary | ICD-10-CM | POA: Diagnosis not present

## 2020-03-30 DIAGNOSIS — M7918 Myalgia, other site: Secondary | ICD-10-CM | POA: Diagnosis not present

## 2020-03-30 DIAGNOSIS — Z87891 Personal history of nicotine dependence: Secondary | ICD-10-CM | POA: Diagnosis not present

## 2020-03-30 DIAGNOSIS — Z79899 Other long term (current) drug therapy: Secondary | ICD-10-CM | POA: Insufficient documentation

## 2020-03-30 DIAGNOSIS — I10 Essential (primary) hypertension: Secondary | ICD-10-CM | POA: Diagnosis not present

## 2020-03-30 DIAGNOSIS — R05 Cough: Secondary | ICD-10-CM

## 2020-03-30 DIAGNOSIS — U071 COVID-19: Secondary | ICD-10-CM | POA: Diagnosis not present

## 2020-03-30 DIAGNOSIS — R509 Fever, unspecified: Secondary | ICD-10-CM | POA: Diagnosis present

## 2020-03-30 DIAGNOSIS — R059 Cough, unspecified: Secondary | ICD-10-CM

## 2020-03-30 DIAGNOSIS — B349 Viral infection, unspecified: Secondary | ICD-10-CM

## 2020-03-30 LAB — URINALYSIS, ROUTINE W REFLEX MICROSCOPIC
Bilirubin Urine: NEGATIVE
Glucose, UA: NEGATIVE mg/dL
Ketones, ur: NEGATIVE mg/dL
Leukocytes,Ua: NEGATIVE
Nitrite: NEGATIVE
Protein, ur: NEGATIVE mg/dL
Specific Gravity, Urine: <1.005 — ABNORMAL LOW (ref 1.005–1.030)
pH: 6 (ref 5.0–8.0)

## 2020-03-30 LAB — COMPREHENSIVE METABOLIC PANEL
ALT: 20 U/L (ref 0–44)
AST: 19 U/L (ref 15–41)
Albumin: 3.9 g/dL (ref 3.5–5.0)
Alkaline Phosphatase: 96 U/L (ref 38–126)
Anion gap: 9 (ref 5–15)
BUN: 7 mg/dL (ref 6–20)
CO2: 23 mmol/L (ref 22–32)
Calcium: 8.9 mg/dL (ref 8.9–10.3)
Chloride: 104 mmol/L (ref 98–111)
Creatinine, Ser: 1.02 mg/dL — ABNORMAL HIGH (ref 0.44–1.00)
GFR calc Af Amer: >60 mL/min (ref >60)
GFR calc non Af Amer: >60 mL/min (ref >60)
Glucose, Bld: 105 mg/dL — ABNORMAL HIGH (ref 70–99)
Potassium: 3.6 mmol/L (ref 3.5–5.1)
Sodium: 136 mmol/L (ref 135–145)
Total Bilirubin: 0.4 mg/dL (ref 0.3–1.2)
Total Protein: 7.5 g/dL (ref 6.5–8.1)

## 2020-03-30 LAB — CBC WITH DIFFERENTIAL/PLATELET
Abs Immature Granulocytes: 0.03 10*3/uL (ref 0.00–0.07)
Basophils Absolute: 0 10*3/uL (ref 0.0–0.1)
Basophils Relative: 0 %
Eosinophils Absolute: 0.1 10*3/uL (ref 0.0–0.5)
Eosinophils Relative: 1 %
HCT: 33.4 % — ABNORMAL LOW (ref 36.0–46.0)
Hemoglobin: 11 g/dL — ABNORMAL LOW (ref 12.0–15.0)
Immature Granulocytes: 1 %
Lymphocytes Relative: 6 %
Lymphs Abs: 0.4 10*3/uL — ABNORMAL LOW (ref 0.7–4.0)
MCH: 21.6 pg — ABNORMAL LOW (ref 26.0–34.0)
MCHC: 32.9 g/dL (ref 30.0–36.0)
MCV: 65.5 fL — ABNORMAL LOW (ref 80.0–100.0)
Monocytes Absolute: 0.7 10*3/uL (ref 0.1–1.0)
Monocytes Relative: 11 %
Neutro Abs: 5.4 10*3/uL (ref 1.7–7.7)
Neutrophils Relative %: 81 %
Platelets: 246 10*3/uL (ref 150–400)
RBC: 5.1 MIL/uL (ref 3.87–5.11)
RDW: 17.3 % — ABNORMAL HIGH (ref 11.5–15.5)
Smear Review: NORMAL
WBC: 6.7 10*3/uL (ref 4.0–10.5)
nRBC: 0 % (ref 0.0–0.2)

## 2020-03-30 LAB — SARS CORONAVIRUS 2 (TAT 6-24 HRS): SARS Coronavirus 2: POSITIVE — AB

## 2020-03-30 LAB — URINALYSIS, MICROSCOPIC (REFLEX)
Bacteria, UA: NONE SEEN
WBC, UA: NONE SEEN WBC/hpf (ref 0–5)

## 2020-03-30 LAB — PREGNANCY, URINE: Preg Test, Ur: NEGATIVE

## 2020-03-30 LAB — LACTIC ACID, PLASMA: Lactic Acid, Venous: 1.1 mmol/L (ref 0.5–1.9)

## 2020-03-30 MED ORDER — SODIUM CHLORIDE 0.9 % IV BOLUS
1000.0000 mL | Freq: Once | INTRAVENOUS | Status: AC
  Administered 2020-03-30: 1000 mL via INTRAVENOUS

## 2020-03-30 MED ORDER — ACETAMINOPHEN 500 MG PO TABS
1000.0000 mg | ORAL_TABLET | Freq: Once | ORAL | Status: AC
  Administered 2020-03-30: 1000 mg via ORAL
  Filled 2020-03-30: qty 2

## 2020-03-30 MED ORDER — IBUPROFEN 800 MG PO TABS
800.0000 mg | ORAL_TABLET | Freq: Once | ORAL | Status: AC
  Administered 2020-03-30: 800 mg via ORAL
  Filled 2020-03-30: qty 1

## 2020-03-30 NOTE — ED Provider Notes (Signed)
[facility] EMERGENCY DEPARTMENT Provider Note   CSN: [id number] Arrival date & time: 03/30/20  0248     History Chief Complaint  Patient presents with  . Fever  . Generalized Body Aches    Lindsey Ruiz is a 39 y.o. female.  Patient has been sick for few days.  She started with sinus congestion a few days ago.  She had a Covid test 2 days ago that was negative.  Tonight she started to develop headache, fever, chills, body aches.  She has now had some cough and chest congestion.  No nausea, vomiting or diarrhea.        Past Medical History:  Diagnosis Date  . Abnormal Pap smear of cervix    01-05-17 neg HPV HR + 16,18/45 neg  . Anxiety   . Hypertension   . Migraine     Patient Active Problem List   Diagnosis Date Noted  . BRCA2 gene mutation negative 12/19/2017  . Herpes simplex 12/19/2017  . Menorrhagia 12/19/2017  . Type 2 diabetes mellitus ([place]) 12/19/2017  . Vitamin D deficiency 12/19/2017  . Breast mass 11/20/2017  . Family history of breast cancer in mother 11/20/2017  . Mastodynia of right breast 01/18/2017  . Status post right foot surgery 11/25/2015  . Tenosynovitis of right foot 11/11/2015  . 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 Pain in lower limb 10/22/2015  . Migraine 04/09/2015  . Essential hypertension 04/09/2015  . 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 Panic attack 09/28/2014  . Anxiety 09/28/2014  . Chronic back pain 09/28/2014    Past Surgical History:  Procedure Laterality Date  . breast cyst    . BREAST LUMPECTOMY  11/22/2017  . BUNIONECTOMY    . Cotton Osteotomy w/ Bone Graft Right 11/19/2015   RT FOOT  . Cotton Osteotomy w/ Bone Graft Left 06/02/2016   LT FOOT  . nexplanon insertion     inserted 09-06-15  . 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 TONSILLECTOMY AND ADENOIDECTOMY       OB History    Gravida  5   Para  2   Term  2    Preterm  0   AB  2   Living  2     SAB  1   TAB  1   Ectopic  0   Multiple  0   Live Births  2           Family History  Problem Relation Age of Onset  . Breast cancer Mother   . Lung cancer Paternal Aunt   . Diabetes Maternal Grandmother   . Hypertension Maternal Grandfather   . Heart disease Maternal Grandfather        chf  . Esophageal cancer Paternal Grandmother   . Hypertension Paternal Grandfather   . Heart attack Paternal Grandfather   . Diabetes Paternal Aunt   . Stroke Father     Social History   Tobacco Use  . Smoking status: Former Research scientist (life sciences)  . Smokeless tobacco: Never Used  Substance Use Topics  . Alcohol use: No    Alcohol/week: 0.0 standard drinks  . Drug use: No    Home Medications Prior to Admission medications   Medication Sig Start Date End Date Taking? Authorizing Provider  albuterol (PROVENTIL HFA;VENTOLIN HFA) 108 (90 Base) MCG/ACT inhaler Inhale 2 puffs into the lungs every 4 (four) hours as needed for wheezing or  shortness of breath. 01/03/19   Orpah Greek, MD  cetirizine (ZYRTEC) 10 MG tablet Take by mouth. 09/28/16   [provider]  clonazePAM (KLONOPIN) 0.5 MG tablet Take by mouth as needed.  09/24/16   [provider]  FOLIC ACID PO Take by mouth.    [provider]  metroNIDAZOLE (FLAGYL) 500 MG tablet Take 1 tablet (500 mg total) by mouth 2 (two) times daily. 04/16/19   Eustaquio Maize, PA-C  oxyCODONE-acetaminophen (PERCOCET) 7.5-325 MG tablet Take 1 tablet by mouth every 8 (eight) hours as needed. 03/05/18   Sheard, Myeong O, DPM  PROAIR HFA 108 (90 Base) MCG/ACT inhaler INHALE 2 PUFFS TID PRN FOR WHEEZING 12/06/16   [provider]  UNABLE TO FIND cbd oil    [provider]    Allergies    Kenalog [triamcinolone acetonide], Pineapple, Penicillins, Sulfa antibiotics, and Sumatriptan  Review of Systems   Review of Systems  Constitutional: Positive for chills and fever.    HENT: Positive for congestion.   Respiratory: Positive for cough.   Neurological: Positive for headaches.  All other systems reviewed and are negative.   Physical Exam Updated Vital Signs BP (!) 162/97 (BP Location: Left Arm)   Pulse (!) 114   Temp (!) 102.8 F (39.3 C) (Oral) Comment: RN Vivien Rota informed  Resp 20   LMP 03/23/2020 (Approximate)   SpO2 98%   Breastfeeding Unknown   Physical Exam Vitals and nursing note reviewed.  Constitutional:      General: She is not in acute distress.    Appearance: Normal appearance. She is well-developed.  HENT:     Head: Normocephalic and atraumatic.     Right Ear: Hearing normal.     Left Ear: Hearing normal.     Nose: Nose normal.  Eyes:     Conjunctiva/sclera: Conjunctivae normal.     Pupils: Pupils are equal, round, and reactive to light.  Cardiovascular:     Rate and Rhythm: Regular rhythm. Tachycardia present.     Heart sounds: S1 normal and S2 normal. No murmur. No friction rub. No gallop.   Pulmonary:     Effort: Pulmonary effort is normal. No respiratory distress.     Breath sounds: Normal breath sounds.  Chest:     Chest wall: No tenderness.  Abdominal:     General: Bowel sounds are normal.     Palpations: Abdomen is soft.     Tenderness: There is no abdominal tenderness. There is no guarding or rebound. Negative signs include Murphy's sign and McBurney's sign.     Hernia: No hernia is present.  Musculoskeletal:        General: Normal range of motion.     Cervical back: Normal range of motion and neck supple.  Skin:    General: Skin is warm and dry.     Findings: No rash.  Neurological:     Mental Status: She is alert and oriented to person, place, and time.     GCS: GCS eye subscore is 4. GCS verbal subscore is 5. GCS motor subscore is 6.     Cranial Nerves: No cranial nerve deficit.     Sensory: No sensory deficit.     Coordination: Coordination normal.  Psychiatric:        Speech: Speech normal.         Behavior: Behavior normal.        Thought Content: Thought content normal.     ED Results / Procedures /  Treatments   Labs (all labs ordered are listed, but only abnormal results are displayed) Labs Reviewed  COMPREHENSIVE METABOLIC PANEL - Abnormal; Notable for the following components:      Result Value   Glucose, Bld 105 (*)    Creatinine, Ser 1.02 (*)    All other components within normal limits  CBC WITH DIFFERENTIAL/PLATELET - Abnormal; Notable for the following components:   Hemoglobin 11.0 (*)    HCT 33.4 (*)    MCV 65.5 (*)    MCH 21.6 (*)    RDW 17.3 (*)    Lymphs Abs 0.4 (*)    All other components within normal limits  URINALYSIS, ROUTINE W REFLEX MICROSCOPIC - Abnormal; Notable for the following components:   Color, Urine STRAW (*)    Specific Gravity, Urine <1.005 (*)    Hgb urine dipstick TRACE (*)    All other components within normal limits  SARS CORONAVIRUS 2 (TAT 6-24 HRS)  LACTIC ACID, PLASMA  PREGNANCY, URINE  URINALYSIS, MICROSCOPIC (REFLEX)  LACTIC ACID, PLASMA    EKG None  Radiology DG Chest Port 1 View  Result Date: 03/30/2020 CLINICAL DATA:  Body aches and fever EXAM: PORTABLE CHEST 1 VIEW COMPARISON:  None. FINDINGS: The heart size and mediastinal contours are within normal limits. Both lungs are clear. The visualized skeletal structures are unremarkable. IMPRESSION: No active disease. Electronically Signed   By: Prudencio Pair M.D.   On: 03/30/2020 04:18    Procedures Procedures (including critical care time)  Medications Ordered in ED Medications  acetaminophen (TYLENOL) tablet 1,000 mg (1,000 mg Oral Given 03/30/20 0331)  ibuprofen (ADVIL) tablet 800 mg (800 mg Oral Given 03/30/20 0331)  sodium chloride 0.9 % bolus 1,000 mL (1,000 mLs Intravenous New Bag/Given 03/30/20 8550)    ED Course  I have reviewed the triage vital signs and the nursing notes.  Pertinent labs & imaging results that were available during my care of the patient were  reviewed by me and considered in my medical decision making (see chart for details).    MDM Rules/Calculators/A&P                      Patient presents to the emergency department for evaluation of febrile illness.  Patient has had cough, chest congestion and upper respiratory infection symptoms.  She had a Covid test 2 days ago that was negative.  It was, however, a rapid antigen test.  Will send confirmatory Covid testing.  All of her work-up is unremarkable.  No sign of sepsis.  All blood work is normal, urinalysis was normal.  Patient has been hydrated and is sleeping comfortably.  She will be appropriate for discharge and will treat symptomatically, fever control, hydration, etc. Final Clinical Impression(s) / ED Diagnoses Final diagnoses:  Viral illness    Rx / DC Orders ED Discharge Orders    None       Satoru Milich, Gwenyth Allegra, MD 03/30/20 [phone]

## 2020-03-30 NOTE — ED Triage Notes (Signed)
Body aches, fever, headache since 2200 last night.Did not take any OTC meds

## 2021-02-03 ENCOUNTER — Other Ambulatory Visit: Payer: Self-pay

## 2021-02-03 ENCOUNTER — Encounter (HOSPITAL_BASED_OUTPATIENT_CLINIC_OR_DEPARTMENT_OTHER): Payer: Self-pay | Admitting: Emergency Medicine

## 2021-02-03 ENCOUNTER — Emergency Department (HOSPITAL_BASED_OUTPATIENT_CLINIC_OR_DEPARTMENT_OTHER)
Admission: EM | Admit: 2021-02-03 | Discharge: 2021-02-03 | Disposition: A | Discharge status: Home / Self Care | Payer: Medicaid Other | Attending: Emergency Medicine | Admitting: Emergency Medicine

## 2021-02-03 DIAGNOSIS — Z20822 Contact with and (suspected) exposure to covid-19: Secondary | ICD-10-CM | POA: Insufficient documentation

## 2021-02-03 DIAGNOSIS — Z8616 Personal history of COVID-19: Secondary | ICD-10-CM | POA: Diagnosis not present

## 2021-02-03 DIAGNOSIS — R000 Tachycardia, unspecified: Secondary | ICD-10-CM | POA: Diagnosis not present

## 2021-02-03 DIAGNOSIS — I10 Essential (primary) hypertension: Secondary | ICD-10-CM | POA: Diagnosis not present

## 2021-02-03 DIAGNOSIS — Z87891 Personal history of nicotine dependence: Secondary | ICD-10-CM | POA: Diagnosis not present

## 2021-02-03 DIAGNOSIS — R509 Fever, unspecified: Secondary | ICD-10-CM

## 2021-02-03 DIAGNOSIS — E119 Type 2 diabetes mellitus without complications: Secondary | ICD-10-CM | POA: Diagnosis not present

## 2021-02-03 DIAGNOSIS — B349 Viral infection, unspecified: Secondary | ICD-10-CM | POA: Diagnosis not present

## 2021-02-03 LAB — CBC WITH DIFFERENTIAL/PLATELET
Abs Immature Granulocytes: 0.03 10*3/uL (ref 0.00–0.07)
Basophils Absolute: 0 10*3/uL (ref 0.0–0.1)
Basophils Relative: 0 %
Eosinophils Absolute: 0 10*3/uL (ref 0.0–0.5)
Eosinophils Relative: 0 %
HCT: 36 % (ref 36.0–46.0)
Hemoglobin: 12.2 g/dL (ref 12.0–15.0)
Immature Granulocytes: 0 %
Lymphocytes Relative: 19 %
Lymphs Abs: 1.7 10*3/uL (ref 0.7–4.0)
MCH: 24 pg — ABNORMAL LOW (ref 26.0–34.0)
MCHC: 33.9 g/dL (ref 30.0–36.0)
MCV: 70.7 fL — ABNORMAL LOW (ref 80.0–100.0)
Monocytes Absolute: 1 10*3/uL (ref 0.1–1.0)
Monocytes Relative: 11 %
Neutro Abs: 6.3 10*3/uL (ref 1.7–7.7)
Neutrophils Relative %: 70 %
Platelets: 219 10*3/uL (ref 150–400)
RBC: 5.09 MIL/uL (ref 3.87–5.11)
RDW: 13.7 % (ref 11.5–15.5)
Smear Review: NORMAL
WBC: 9.1 10*3/uL (ref 4.0–10.5)
nRBC: 0 % (ref 0.0–0.2)

## 2021-02-03 LAB — BASIC METABOLIC PANEL
Anion gap: 12 (ref 5–15)
BUN: 17 mg/dL (ref 6–20)
CO2: 23 mmol/L (ref 22–32)
Calcium: 8.7 mg/dL — ABNORMAL LOW (ref 8.9–10.3)
Chloride: 100 mmol/L (ref 98–111)
Creatinine, Ser: 0.98 mg/dL (ref 0.44–1.00)
GFR, Estimated: >60 mL/min (ref >60)
Glucose, Bld: 111 mg/dL — ABNORMAL HIGH (ref 70–99)
Potassium: 3.6 mmol/L (ref 3.5–5.1)
Sodium: 135 mmol/L (ref 135–145)

## 2021-02-03 MED ORDER — SODIUM CHLORIDE 0.9 % IV BOLUS
1000.0000 mL | Freq: Once | INTRAVENOUS | Status: AC
  Administered 2021-02-03: 1000 mL via INTRAVENOUS

## 2021-02-03 MED ORDER — ACETAMINOPHEN 325 MG PO TABS
650.0000 mg | ORAL_TABLET | Freq: Once | ORAL | Status: AC
  Administered 2021-02-03: 650 mg via ORAL
  Filled 2021-02-03: qty 2

## 2021-02-03 NOTE — Discharge Instructions (Addendum)
Motrin Tylenol may help keep the fever down.  Try and keep yourself hydrated.  The Covid test has been done but has not resulted yet.  You can look for the results under MyChart

## 2021-02-03 NOTE — ED Triage Notes (Signed)
Pt reports fever since Monday. Reports 102.71F at home today. Reports nausea. Tylenol 30 minutes ago.

## 2021-02-04 LAB — SARS CORONAVIRUS 2 (TAT 6-24 HRS): SARS Coronavirus 2: NEGATIVE

## 2021-02-04 NOTE — ED Provider Notes (Signed)
[facility] EMERGENCY DEPARTMENT Provider Note   CSN: [id number] Arrival date & time: 02/03/21  2017     History Chief Complaint  Patient presents with  . Fever    Lindsey Ruiz is a 40 y.o. female.  HPI Patient presents with fever.  Has had for the last around 3 days.  States she feels a little bad all over but no headache.  No confusion.  No neck pain.  No nausea or vomiting.  No sore throat.  No cough.  No dysuria.  No known sick contacts.  Has previously had Covid but has not had vaccine.  Decreased appetite but no frank nausea or vomiting.    Past Medical History:  Diagnosis Date  . Abnormal Pap smear of cervix    01-05-17 neg HPV HR + 16,18/45 neg  . Anxiety   . Hypertension   . Migraine     Patient Active Problem List   Diagnosis Date Noted  . BRCA2 gene mutation negative 12/19/2017  . Herpes simplex 12/19/2017  . Menorrhagia 12/19/2017  . Type 2 diabetes mellitus ([place]) 12/19/2017  . Vitamin D deficiency 12/19/2017  . Breast mass 11/20/2017  . Family history of breast cancer in mother 11/20/2017  . Mastodynia of right breast 01/18/2017  . Status post right foot surgery 11/25/2015  . Tenosynovitis of right foot 11/11/2015  . 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 Pain in lower limb 10/22/2015  . Migraine 04/09/2015  . Essential hypertension 04/09/2015  . 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 Panic attack 09/28/2014  . Anxiety 09/28/2014  . Chronic back pain 09/28/2014    Past Surgical History:  Procedure Laterality Date  . breast cyst    . BREAST LUMPECTOMY  11/22/2017  . BUNIONECTOMY    . Cotton Osteotomy w/ Bone Graft Right 11/19/2015   RT FOOT  . Cotton Osteotomy w/ Bone Graft Left 06/02/2016   LT FOOT  . nexplanon insertion     inserted 09-06-15  . 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 TONSILLECTOMY AND ADENOIDECTOMY       OB History    Gravida  5    Para  2   Term  2   Preterm  0   AB  2   Living  2     SAB  1   IAB  1   Ectopic  0   Multiple  0   Live Births  2           Family History  Problem Relation Age of Onset  . Breast cancer Mother   . Lung cancer Paternal Aunt   . Diabetes Maternal Grandmother   . Hypertension Maternal Grandfather   . Heart disease Maternal Grandfather        chf  . Esophageal cancer Paternal Grandmother   . Hypertension Paternal Grandfather   . Heart attack Paternal Grandfather   . Diabetes Paternal Aunt   . Stroke Father     Social History   Tobacco Use  . Smoking status: Former Research scientist (life sciences)  . Smokeless tobacco: Never Used  Vaping Use  . Vaping Use: Never used  Substance Use Topics  . Alcohol use: No    Alcohol/week: 0.0 standard drinks  . Drug use: No    Home Medications Prior to Admission medications   Medication Sig Start Date End Date Taking? Authorizing Provider  albuterol (PROVENTIL HFA;VENTOLIN HFA) 108 (90 Base) MCG/ACT  inhaler Inhale 2 puffs into the lungs every 4 (four) hours as needed for wheezing or shortness of breath. 01/03/19   Orpah Greek, MD  cetirizine (ZYRTEC) 10 MG tablet Take by mouth. 09/28/16   [provider]  clonazePAM (KLONOPIN) 0.5 MG tablet Take by mouth as needed.  09/24/16   [provider]  FOLIC ACID PO Take by mouth.    [provider]  metroNIDAZOLE (FLAGYL) 500 MG tablet Take 1 tablet (500 mg total) by mouth 2 (two) times daily. 04/16/19   Eustaquio Maize, PA-C  oxyCODONE-acetaminophen (PERCOCET) 7.5-325 MG tablet Take 1 tablet by mouth every 8 (eight) hours as needed. 03/05/18   Sheard, Myeong O, DPM  PROAIR HFA 108 (90 Base) MCG/ACT inhaler INHALE 2 PUFFS TID PRN FOR WHEEZING 12/06/16   [provider]  UNABLE TO FIND cbd oil    [provider]    Allergies    Kenalog [triamcinolone acetonide], Pineapple, Penicillins, Sulfa antibiotics, and Sumatriptan  Review of Systems    Review of Systems  Constitutional: Positive for appetite change and fever.  HENT: Negative for congestion.   Respiratory: Negative for shortness of breath.   Cardiovascular: Negative for chest pain.  Gastrointestinal: Negative for abdominal pain.  Genitourinary: Negative for enuresis and flank pain.  Musculoskeletal: Negative for back pain.  Skin: Negative for rash.  Neurological: Negative for weakness.  Psychiatric/Behavioral: Negative for confusion.    Physical Exam Updated Vital Signs BP 113/70 (BP Location: Right Arm)   Pulse 99   Temp 99.1 F (37.3 C) (Oral)   Resp 18   Wt 78.9 kg   LMP 01/17/2021 (Exact Date)   SpO2 99%   BMI 27.25 kg/m   Physical Exam Vitals and nursing note reviewed.  Constitutional:      Appearance: Normal appearance.  HENT:     Head: Atraumatic.     Mouth/Throat:     Mouth: Mucous membranes are moist.     Pharynx: No oropharyngeal exudate or posterior oropharyngeal erythema.  Eyes:     Pupils: Pupils are equal, round, and reactive to light.  Cardiovascular:     Rate and Rhythm: Tachycardia present.  Pulmonary:     Breath sounds: No wheezing, rhonchi or rales.  Abdominal:     Tenderness: There is no abdominal tenderness.  Musculoskeletal:        General: No tenderness.  Skin:    General: Skin is warm.     Capillary Refill: Capillary refill takes less than 2 seconds.  Neurological:     Mental Status: She is oriented to person, place, and time.  Psychiatric:        Mood and Affect: Mood normal.     ED Results / Procedures / Treatments   Labs (all labs ordered are listed, but only abnormal results are displayed) Labs Reviewed  CBC WITH DIFFERENTIAL/PLATELET - Abnormal; Notable for the following components:      Result Value   MCV 70.7 (*)    MCH 24.0 (*)    All other components within normal limits  BASIC METABOLIC PANEL - Abnormal; Notable for the following components:   Glucose, Bld 111 (*)    Calcium 8.7 (*)    All other  components within normal limits  SARS CORONAVIRUS 2 (TAT 6-24 HRS)    EKG None  Radiology No results found.  Procedures Procedures   Medications Ordered in ED Medications  sodium chloride 0.9 % bolus 1,000 mL (0 mLs Intravenous Stopped 02/03/21 2221)  acetaminophen (  TYLENOL) tablet 650 mg (650 mg Oral Given 02/03/21 2051)    ED Course  I have reviewed the triage vital signs and the nursing notes.  Pertinent labs & imaging results that were available during my care of the patient were reviewed by me and considered in my medical decision making (see chart for details).    MDM Rules/Calculators/A&P                          Patient with fever but no clear cause.  Well-appearing.  Fever is broken and feeling better.  Lungs clear.  No dysuria.  No nausea vomiting diarrhea.  No sore throat.  Covid test pending but is not resulted yet.  Discharge home with outpatient follow-up as needed. Final Clinical Impression(s) / ED Diagnoses Final diagnoses:  Fever, unspecified fever cause  Viral syndrome    Rx / DC Orders ED Discharge Orders    None       Davonna Belling, MD 02/04/21 0005

## 2021-02-05 ENCOUNTER — Encounter (HOSPITAL_BASED_OUTPATIENT_CLINIC_OR_DEPARTMENT_OTHER): Payer: Self-pay | Admitting: *Deleted

## 2021-02-05 ENCOUNTER — Emergency Department (HOSPITAL_BASED_OUTPATIENT_CLINIC_OR_DEPARTMENT_OTHER): Payer: Medicaid Other

## 2021-02-05 ENCOUNTER — Emergency Department (HOSPITAL_BASED_OUTPATIENT_CLINIC_OR_DEPARTMENT_OTHER)
Admission: EM | Admit: 2021-02-05 | Discharge: 2021-02-06 | Disposition: A | Discharge status: Home / Self Care | Payer: Medicaid Other | Attending: Emergency Medicine | Admitting: Emergency Medicine

## 2021-02-05 ENCOUNTER — Other Ambulatory Visit: Payer: Self-pay

## 2021-02-05 DIAGNOSIS — R509 Fever, unspecified: Secondary | ICD-10-CM | POA: Diagnosis present

## 2021-02-05 DIAGNOSIS — I10 Essential (primary) hypertension: Secondary | ICD-10-CM | POA: Insufficient documentation

## 2021-02-05 DIAGNOSIS — E119 Type 2 diabetes mellitus without complications: Secondary | ICD-10-CM | POA: Insufficient documentation

## 2021-02-05 DIAGNOSIS — Z87891 Personal history of nicotine dependence: Secondary | ICD-10-CM | POA: Insufficient documentation

## 2021-02-05 DIAGNOSIS — N390 Urinary tract infection, site not specified: Secondary | ICD-10-CM | POA: Insufficient documentation

## 2021-02-05 DIAGNOSIS — Z20822 Contact with and (suspected) exposure to covid-19: Secondary | ICD-10-CM | POA: Insufficient documentation

## 2021-02-05 DIAGNOSIS — Z9011 Acquired absence of right breast and nipple: Secondary | ICD-10-CM | POA: Insufficient documentation

## 2021-02-05 LAB — CBC WITH DIFFERENTIAL/PLATELET
Abs Immature Granulocytes: 0.05 10*3/uL (ref 0.00–0.07)
Basophils Absolute: 0 10*3/uL (ref 0.0–0.1)
Basophils Relative: 0 %
Eosinophils Absolute: 0 10*3/uL (ref 0.0–0.5)
Eosinophils Relative: 0 %
HCT: 32.5 % — ABNORMAL LOW (ref 36.0–46.0)
Hemoglobin: 11 g/dL — ABNORMAL LOW (ref 12.0–15.0)
Immature Granulocytes: 0 %
Lymphocytes Relative: 18 %
Lymphs Abs: 2 10*3/uL (ref 0.7–4.0)
MCH: 24 pg — ABNORMAL LOW (ref 26.0–34.0)
MCHC: 33.8 g/dL (ref 30.0–36.0)
MCV: 71 fL — ABNORMAL LOW (ref 80.0–100.0)
Monocytes Absolute: 1.1 10*3/uL — ABNORMAL HIGH (ref 0.1–1.0)
Monocytes Relative: 10 %
Neutro Abs: 8 10*3/uL — ABNORMAL HIGH (ref 1.7–7.7)
Neutrophils Relative %: 72 %
Platelets: 230 10*3/uL (ref 150–400)
RBC: 4.58 MIL/uL (ref 3.87–5.11)
RDW: 13.5 % (ref 11.5–15.5)
WBC: 11.3 10*3/uL — ABNORMAL HIGH (ref 4.0–10.5)
nRBC: 0 % (ref 0.0–0.2)

## 2021-02-05 LAB — COMPREHENSIVE METABOLIC PANEL
ALT: 17 U/L (ref 0–44)
AST: 16 U/L (ref 15–41)
Albumin: 3.5 g/dL (ref 3.5–5.0)
Alkaline Phosphatase: 72 U/L (ref 38–126)
Anion gap: 12 (ref 5–15)
BUN: 11 mg/dL (ref 6–20)
CO2: 21 mmol/L — ABNORMAL LOW (ref 22–32)
Calcium: 8.6 mg/dL — ABNORMAL LOW (ref 8.9–10.3)
Chloride: 102 mmol/L (ref 98–111)
Creatinine, Ser: 0.93 mg/dL (ref 0.44–1.00)
GFR, Estimated: >60 mL/min (ref >60)
Glucose, Bld: 103 mg/dL — ABNORMAL HIGH (ref 70–99)
Potassium: 3.7 mmol/L (ref 3.5–5.1)
Sodium: 135 mmol/L (ref 135–145)
Total Bilirubin: 0.3 mg/dL (ref 0.3–1.2)
Total Protein: 7.1 g/dL (ref 6.5–8.1)

## 2021-02-05 LAB — URINALYSIS, ROUTINE W REFLEX MICROSCOPIC
Bilirubin Urine: NEGATIVE
Glucose, UA: NEGATIVE mg/dL
Ketones, ur: 15 mg/dL — AB
Nitrite: POSITIVE — AB
Protein, ur: 30 mg/dL — AB
Specific Gravity, Urine: 1.02 (ref 1.005–1.030)
pH: 6 (ref 5.0–8.0)

## 2021-02-05 LAB — URINALYSIS, MICROSCOPIC (REFLEX)

## 2021-02-05 LAB — LACTIC ACID, PLASMA: Lactic Acid, Venous: 0.7 mmol/L (ref 0.5–1.9)

## 2021-02-05 LAB — PREGNANCY, URINE: Preg Test, Ur: NEGATIVE

## 2021-02-05 LAB — LIPASE, BLOOD: Lipase: 21 U/L (ref 11–51)

## 2021-02-05 MED ORDER — IOHEXOL 300 MG/ML  SOLN
100.0000 mL | Freq: Once | INTRAMUSCULAR | Status: AC | PRN
  Administered 2021-02-05: 100 mL via INTRAVENOUS

## 2021-02-05 MED ORDER — SODIUM CHLORIDE 0.9 % IV BOLUS
1000.0000 mL | Freq: Once | INTRAVENOUS | Status: AC
Start: 2021-02-05 — End: 2021-02-06
  Administered 2021-02-05: 1000 mL via INTRAVENOUS

## 2021-02-05 MED ORDER — ACETAMINOPHEN 325 MG PO TABS
650.0000 mg | ORAL_TABLET | Freq: Once | ORAL | Status: AC
  Administered 2021-02-05: 650 mg via ORAL
  Filled 2021-02-05: qty 2

## 2021-02-05 MED ORDER — CEPHALEXIN 500 MG PO CAPS: 500.0000 mg | ORAL_CAPSULE | Freq: Two times a day (BID) | ORAL | 0 refills | Status: AC

## 2021-02-05 MED ORDER — SODIUM CHLORIDE 0.9 % IV SOLN
2.0000 g | Freq: Once | INTRAVENOUS | Status: AC
  Administered 2021-02-05: 2 g via INTRAVENOUS
  Filled 2021-02-05: qty 20

## 2021-02-05 NOTE — ED Triage Notes (Signed)
Fever since Monday- She was seen here on 3/3 for same

## 2021-02-05 NOTE — ED Provider Notes (Signed)
[facility] Provider Note   CSN: [id number] Arrival date & time: 02/05/21  2010     History Chief Complaint  Patient presents with  . Fever    Lindsey Ruiz is a 40 y.o. female.  40 year old female with history of migraines and hypertension as well as diabetes presents with complaint of right-sided abdominal pain and fever.  Patient states her fever started 4 days ago, she was seen in the emergency room 2 days ago and had a negative Covid test, was given IV fluids and labs.  Patient states her fever has persisted, she developed right-sided abdominal pain yesterday.  Pain is intermittent, does not radiate, nothing makes her pain better or worse.  Patient feels like she has been constipated, and has taken magnesium citrate and done an enema as well as taken Linzess without significant passage of stool.  Consider this may be related to constipation versus decreased appetite and oral intake.  No prior abdominal surgeries, no sick contacts.  No other complaints or concerns today.        Past Medical History:  Diagnosis Date  . Abnormal Pap smear of cervix    01-05-17 neg HPV HR + 16,18/45 neg  . Anxiety   . Hypertension   . Migraine     Patient Active Problem List   Diagnosis Date Noted  . BRCA2 gene mutation negative 12/19/2017  . Herpes simplex 12/19/2017  . Menorrhagia 12/19/2017  . Type 2 diabetes mellitus ([place]) 12/19/2017  . Vitamin D deficiency 12/19/2017  . Breast mass 11/20/2017  . Family history of breast cancer in mother 11/20/2017  . Mastodynia of right breast 01/18/2017  . Status post right foot surgery 11/25/2015  . Tenosynovitis of right foot 11/11/2015  . 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 Pain in lower limb 10/22/2015  . Migraine 04/09/2015  . Essential hypertension 04/09/2015  . 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 Panic attack 09/28/2014  .  Anxiety 09/28/2014  . Chronic back pain 09/28/2014    Past Surgical History:  Procedure Laterality Date  . breast cyst    . BREAST LUMPECTOMY  11/22/2017  . BUNIONECTOMY    . Cotton Osteotomy w/ Bone Graft Right 11/19/2015   RT FOOT  . Cotton Osteotomy w/ Bone Graft Left 06/02/2016   LT FOOT  . nexplanon insertion     inserted 09-06-15  . 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 TONSILLECTOMY AND ADENOIDECTOMY       OB History    Gravida  5   Para  2   Term  2   Preterm  0   AB  2   Living  2     SAB  1   IAB  1   Ectopic  0   Multiple  0   Live Births  2           Family History  Problem Relation Age of Onset  . Breast cancer Mother   . Lung cancer Paternal Aunt   . Diabetes Maternal Grandmother   . Hypertension Maternal Grandfather   . Heart disease Maternal Grandfather        chf  . Esophageal cancer Paternal Grandmother   . Hypertension Paternal Grandfather   . Heart attack Paternal Grandfather   . Diabetes Paternal Aunt   . Stroke Father     Social History   Tobacco Use  . Smoking status:  Former Smoker  . Smokeless tobacco: Never Used  Vaping Use  . Vaping Use: Never used  Substance Use Topics  . Alcohol use: No    Alcohol/week: 0.0 standard drinks  . Drug use: No    Home Medications Prior to Admission medications   Medication Sig Start Date End Date Taking? Authorizing Provider  cephALEXin (KEFLEX) 500 MG capsule Take 1 capsule (500 mg total) by mouth 2 (two) times daily for 7 days. 02/05/21 02/12/21 Yes Tacy Learn, PA-C  albuterol (PROVENTIL HFA;VENTOLIN HFA) 108 (90 Base) MCG/ACT inhaler Inhale 2 puffs into the lungs every 4 (four) hours as needed for wheezing or shortness of breath. 01/03/19   Orpah Greek, MD  cetirizine (ZYRTEC) 10 MG tablet Take by mouth. 09/28/16   [provider]  clonazePAM (KLONOPIN) 0.5 MG tablet Take by mouth as needed.  09/24/16   [provider]  FOLIC ACID PO  Take by mouth.    [provider]  metroNIDAZOLE (FLAGYL) 500 MG tablet Take 1 tablet (500 mg total) by mouth 2 (two) times daily. 04/16/19   Eustaquio Maize, PA-C  oxyCODONE-acetaminophen (PERCOCET) 7.5-325 MG tablet Take 1 tablet by mouth every 8 (eight) hours as needed. 03/05/18   Sheard, Myeong O, DPM  PROAIR HFA 108 (90 Base) MCG/ACT inhaler INHALE 2 PUFFS TID PRN FOR WHEEZING 12/06/16   [provider]  UNABLE TO FIND cbd oil    [provider]    Allergies    Kenalog [triamcinolone acetonide], Pineapple, Penicillins, Sulfa antibiotics, and Sumatriptan  Review of Systems   Review of Systems  Constitutional: Positive for appetite change and fever. Negative for chills.  HENT: Negative for congestion and sore throat.   Respiratory: Negative for cough.   Cardiovascular: Negative for chest pain.  Gastrointestinal: Positive for abdominal pain and constipation. Negative for blood in stool, diarrhea, nausea and vomiting.  Genitourinary: Negative for dysuria, frequency and vaginal discharge.  Musculoskeletal: Negative for arthralgias and myalgias.  Skin: Negative for wound.  Allergic/Immunologic: Positive for immunocompromised state.  Neurological: Negative for headaches.  Hematological: Negative for adenopathy.  All other systems reviewed and are negative.   Physical Exam Updated Vital Signs BP 136/90 (BP Location: Right Arm)   Pulse 91   Temp (!) 101 F (38.3 C) (Oral)   Resp 18   Ht _0  (1.702 m)   Wt 78.9 kg   LMP 01/17/2021 (Exact Date)   SpO2 98%   Breastfeeding No   BMI 27.25 kg/m   Physical Exam Vitals and nursing note reviewed.  Constitutional:      General: She is not in acute distress.    Appearance: She is well-developed and well-nourished. She is not diaphoretic.  HENT:     Head: Normocephalic and atraumatic.  Eyes:     Conjunctiva/sclera: Conjunctivae normal.  Cardiovascular:     Rate and Rhythm: Regular rhythm. Tachycardia  present.     Pulses: Normal pulses.     Heart sounds: Normal heart sounds.  Pulmonary:     Effort: Pulmonary effort is normal.     Breath sounds: Normal breath sounds.  Abdominal:     Palpations: Abdomen is soft.     Tenderness: There is abdominal tenderness in the right upper quadrant and right lower quadrant. There is no right CVA tenderness or left CVA tenderness.  Musculoskeletal:     Cervical back: Neck supple.     Right lower leg: No edema.     Left lower leg: No edema.  Skin:    General: Skin is warm and dry.     Findings: No erythema or rash.  Neurological:     Mental Status: She is alert and oriented to person, place, and time.  Psychiatric:        Mood and Affect: Mood and affect normal.        Behavior: Behavior normal.     ED Results / Procedures / Treatments   Labs (all labs ordered are listed, but only abnormal results are displayed) Labs Reviewed  COMPREHENSIVE METABOLIC PANEL - Abnormal; Notable for the following components:      Result Value   CO2 21 (*)    Glucose, Bld 103 (*)    Calcium 8.6 (*)    All other components within normal limits  CBC WITH DIFFERENTIAL/PLATELET - Abnormal; Notable for the following components:   WBC 11.3 (*)    Hemoglobin 11.0 (*)    HCT 32.5 (*)    MCV 71.0 (*)    MCH 24.0 (*)    Neutro Abs 8.0 (*)    Monocytes Absolute 1.1 (*)    All other components within normal limits  URINALYSIS, ROUTINE W REFLEX MICROSCOPIC - Abnormal; Notable for the following components:   APPearance HAZY (*)    Hgb urine dipstick LARGE (*)    Ketones, ur 15 (*)    Protein, ur 30 (*)    Nitrite POSITIVE (*)    Leukocytes,Ua MODERATE (*)    All other components within normal limits  URINALYSIS, MICROSCOPIC (REFLEX) - Abnormal; Notable for the following components:   Bacteria, UA MANY (*)    All other components within normal limits  URINE CULTURE  LACTIC ACID, PLASMA  LIPASE, BLOOD  PREGNANCY, URINE    EKG None  Radiology DG Chest 2  View  Result Date: 02/05/2021 CLINICAL DATA:  Fever and lower right abdominal pain EXAM: CHEST - 2 VIEW COMPARISON:  03/30/2020 FINDINGS: No consolidation, features of edema, pneumothorax, or effusion. Pulmonary vascularity is normally distributed. The cardiomediastinal contours are unremarkable. No acute osseous or soft tissue abnormality. IMPRESSION: No acute cardiopulmonary abnormality. Electronically Signed   By: Lovena Le M.D.   On: 02/05/2021 21:53   CT Abdomen Pelvis W Contrast  Result Date: 02/05/2021 CLINICAL DATA:  Abdominal pain, fever EXAM: CT ABDOMEN AND PELVIS WITH CONTRAST TECHNIQUE: Multidetector CT imaging of the abdomen and pelvis was performed using the standard protocol following bolus administration of intravenous contrast. CONTRAST:  14m OMNIPAQUE IOHEXOL 300 MG/ML  SOLN COMPARISON:  MRI 04/16/2019 FINDINGS: Lower chest: The visualized lung bases are clear. The visualized heart and pericardium are unremarkable. Hepatobiliary: The previously identified subtle mass within segment 8 of the liver is poorly visualized and extremely subtle, but persists and demonstrates a small central hypointensity suggesting a small central scar in the setting of an [place] Inferiorly, within segment 6 of the liver, is a new 18 mm hypodensity which is new from prior examination and nonspecific. This may represent the sequela of trauma or inflammation. A solid mass lesion is considered less likely as this does not appear to demonstrate significant mass effect upon the adjacent vasculature. The liver is otherwise unremarkable. Gallbladder unremarkable. No intra or extrahepatic biliary ductal dilation. Pancreas: Unremarkable Spleen: Unremarkable Adrenals/Urinary Tract: The adrenal glands are unremarkable. The kidneys are normal in size and position. Simple cortical cyst again identified within the interpolar region of the left kidney. The kidneys are otherwise unremarkable. The bladder is unremarkable.  Stomach/Bowel: Stomach is within normal limits.  Appendix appears normal. No evidence of bowel wall thickening, distention, or inflammatory changes. Moderate stool within the colon. No free intraperitoneal gas or fluid. Vascular/Lymphatic: Minimal atherosclerotic calcification within the abdominal aorta. No pathologic adenopathy within the abdomen and pelvis. Reproductive: Uterus and bilateral adnexa are unremarkable. Other: Rectum unremarkable.  Tiny fat containing umbilical hernia. Musculoskeletal: No acute bone abnormality. IMPRESSION: No acute intra-abdominal pathology. No definite radiographic explanation for the patient's reported symptoms. Subtle, stable mass within segment 8 of the liver now demonstrating a tiny central scar suggesting focal nodular hyperplasia. This could be confirmed with contrast enhanced (Eovist) MRI examination. Tiny hypodensity within the a inferior right hepatic lobe possibly representing posttraumatic or post inflammatory change. Moderate stool without evidence of obstruction. Atherosclerotic calcification within the abdominal aorta, abnormal in a patient of this age. Correlation for aggressive vascular risk factors is recommended. Aortic Atherosclerosis (ICD10-I70.0). Electronically Signed   By: Fidela Salisbury MD   On: 02/05/2021 23:38    Procedures Procedures   Medications Ordered in ED Medications  acetaminophen (TYLENOL) tablet 650 mg (650 mg Oral Given 02/05/21 2155)  sodium chloride 0.9 % bolus 1,000 mL (1,000 mLs Intravenous New Bag/Given 02/05/21 2229)  iohexol (OMNIPAQUE) 300 MG/ML solution 100 mL (100 mLs Intravenous Contrast Given 02/05/21 2308)  cefTRIAXone (ROCEPHIN) 2 g in sodium chloride 0.9 % 100 mL IVPB (2 g Intravenous New Bag/Given 02/05/21 2321)    ED Course  I have reviewed the triage vital signs and the nursing notes.  Pertinent labs & imaging results that were available during my care of the patient were reviewed by me and considered in my medical  decision making (see chart for details).  Clinical Course as of 02/05/21 2359  Sat Feb 06, 3923  4344 40 year old female returns to the emergency room with ongoing fever now with complaint of right-sided abdominal pain.  Patient reports onset of fever 4 days ago, seen in the ER 2 days ago with negative Covid tests and reassuring labs.  Patient states that she has been trying to treat her constipation at home without much success and now has right abdominal pain.  On exam, has mild tenderness the right side of the abdomen, is otherwise well-appearing.  Patient was given IV fluids.  Her CBC shows a mild leukocytosis with white count of 11.3 with increase in neutrophils.  CMP without significant changes.  Lactic acid reassuring at 0.7.  Lipase within normal limits, pregnancy test is negative.  Urinalysis does show concerns for urinary tract infection related to patient with fever and right-sided abdominal pain however the sample is contaminated.  She is nitrite positive with moderate leukocytes.  A urine culture was sent out and patient was given Rocephin IV which she is tolerating well.  Plan is to discharge on Keflex, recommend follow-up with PCP on Monday if she remains febrile, return to the ED anytime for worsening or concerning symptoms otherwise see PCP when antibiotics complete. [LM]  2358 Discussed incidental finding of scarring on patient's CT and discussed recommendation to follow-up with PCP to review results and discuss possible MRI.  Patient is familiar with prior hepatic mass found when she was pregnant and states that she forgot all about this and the plan to follow-up but agrees to do so now. [LM]    Clinical Course User Index [LM] Roque Lias   MDM Rules/Calculators/A&P  Final Clinical Impression(s) / ED Diagnoses Final diagnoses:  Fever, unspecified fever cause  Urinary tract infection in female    Rx / DC Orders ED Discharge Orders          Ordered    cephALEXin (KEFLEX) 500 MG capsule  2 times daily        02/05/21 2353           Tacy Learn, PA-C 02/05/21 2359    Lucrezia Starch, MD 02/07/21 1451

## 2021-02-05 NOTE — Discharge Instructions (Addendum)
Take Keflex as prescribed and complete the full course. Recheck with your doctor on Monday if you are still running fevers otherwise recheck with your doctor when you finished your antibiotics. Return to the emergency room for any new or worsening symptoms.

## 2021-02-08 LAB — URINE CULTURE: Culture: >=100000 — AB

## 2023-02-27 IMAGING — CT CT ABD-PELV W/ CM
2 of 4 series · 16 of 46 positions shown, 18 images · IV contrast (Omnipaque)
Comparison: MRI 04/16/2019

CLINICAL DATA: Abdominal pain, fever

EXAM:
CT ABDOMEN AND PELVIS WITH CONTRAST
TECHNIQUE: Multidetector CT imaging of the abdomen and pelvis was performed
using the standard protocol following bolus administration of
intravenous contrast.
CONTRAST:  100mL OMNIPAQUE IOHEXOL 300 MG/ML  SOLN

[Series 2: axial st · axial · 0.68mm/px · z∈[+679,+1099]mm · 13 of 92 slices shown, 15 images]
[im 4/92  soft-tissue]
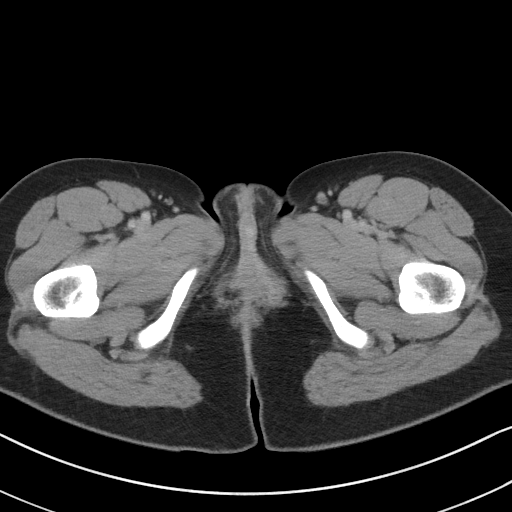
[im 4/92  bone]
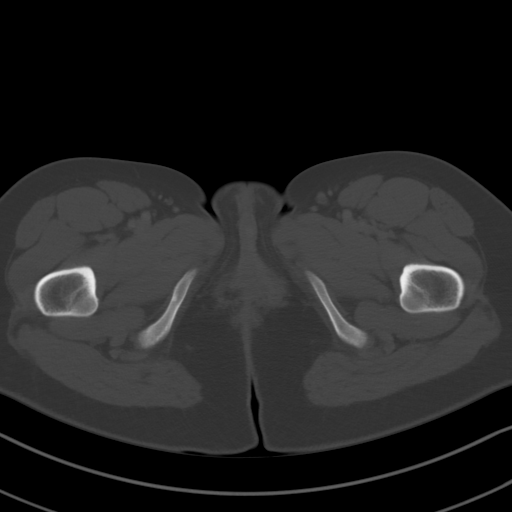
[im 11/92  soft-tissue]
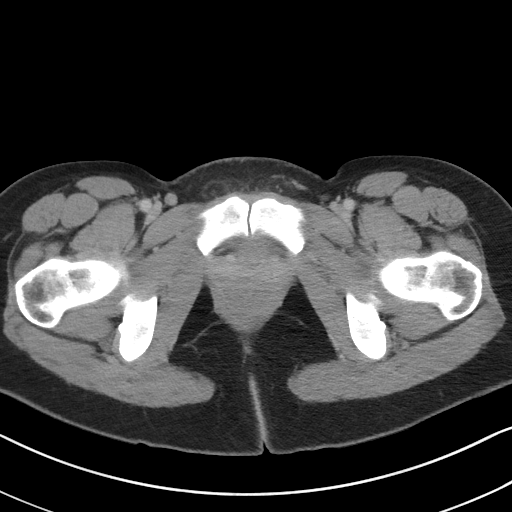
[im 19/92  soft-tissue]
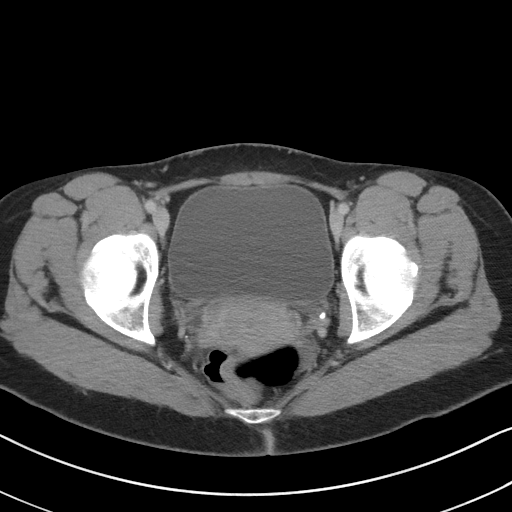
[im 26/92  soft-tissue]
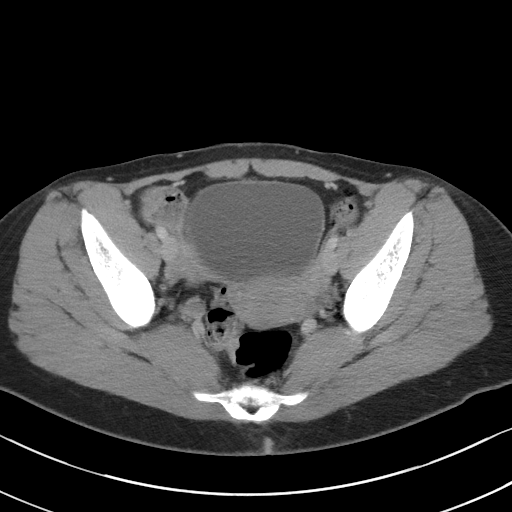
[im 33/92  soft-tissue]
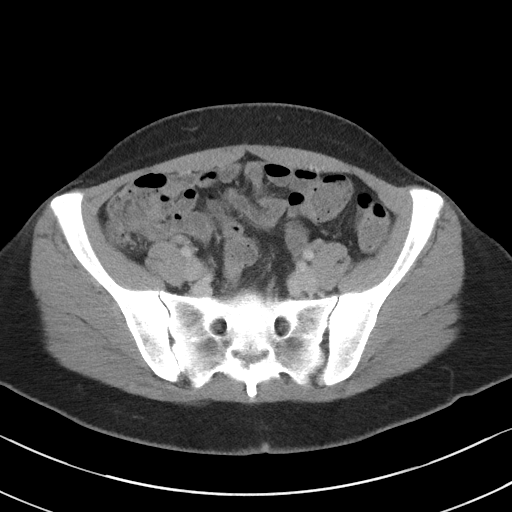
[im 41/92  soft-tissue]
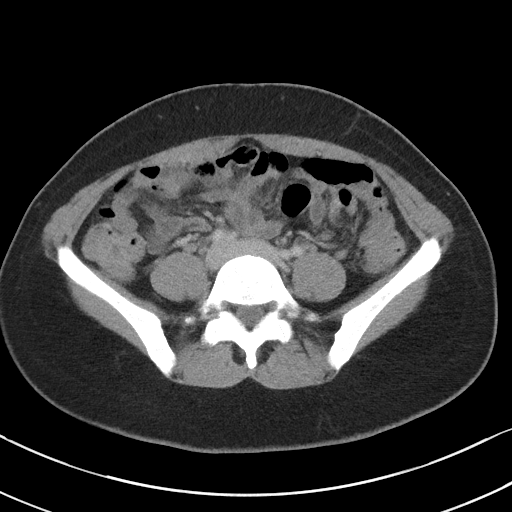
[im 48/92  soft-tissue]
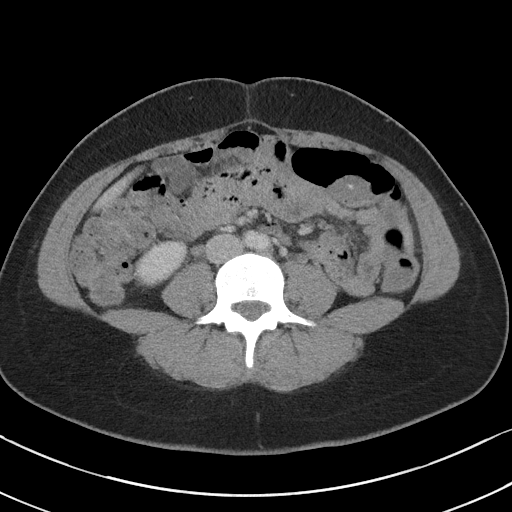
[im 51/92  soft-tissue]
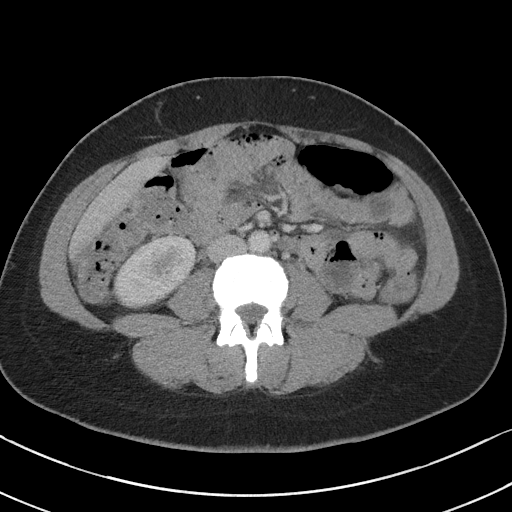
[im 59/92  soft-tissue]
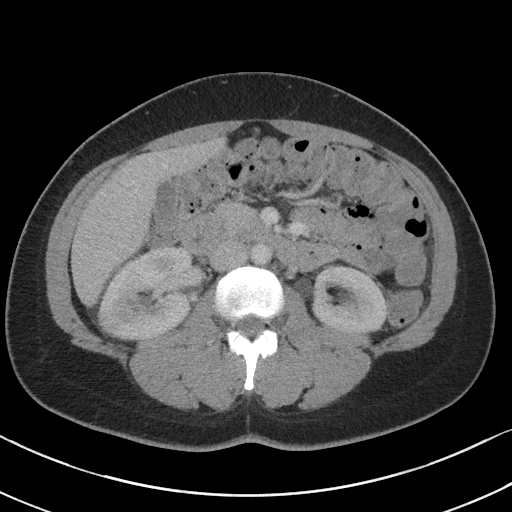
[im 59/92  bone]
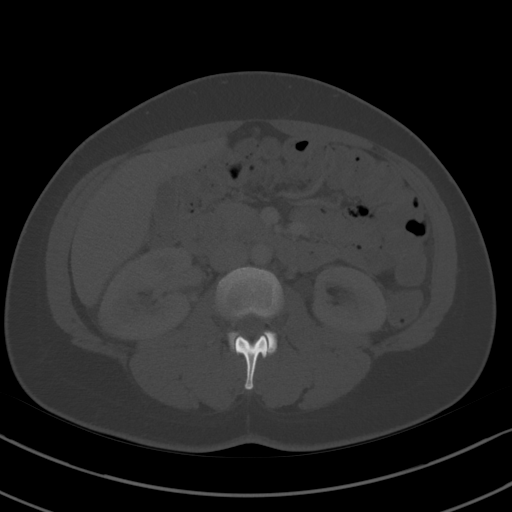
[im 66/92  soft-tissue]
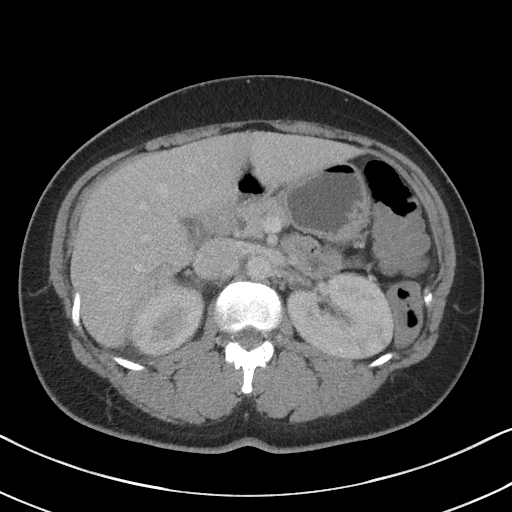
[im 73/92  soft-tissue]
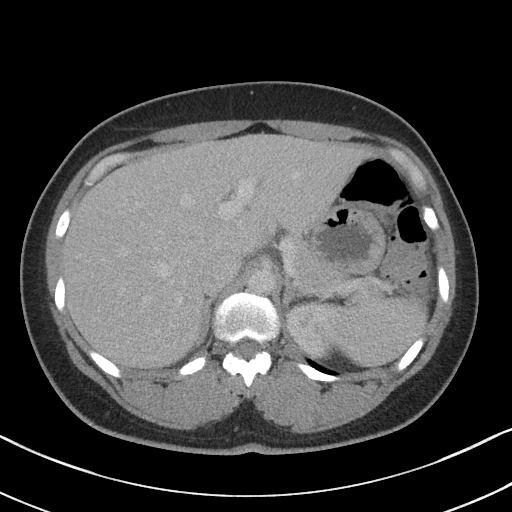
[im 81/92  soft-tissue]
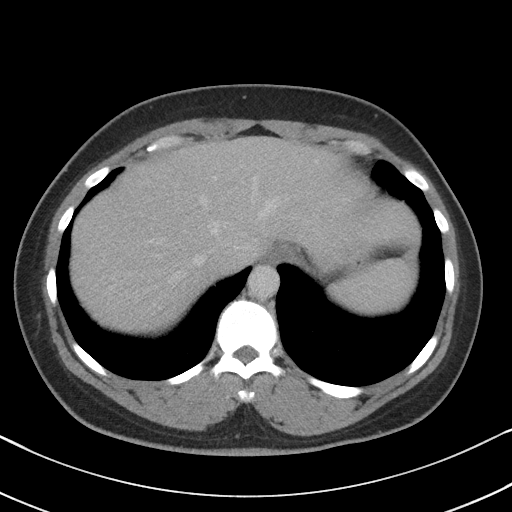
[im 88/92  soft-tissue]
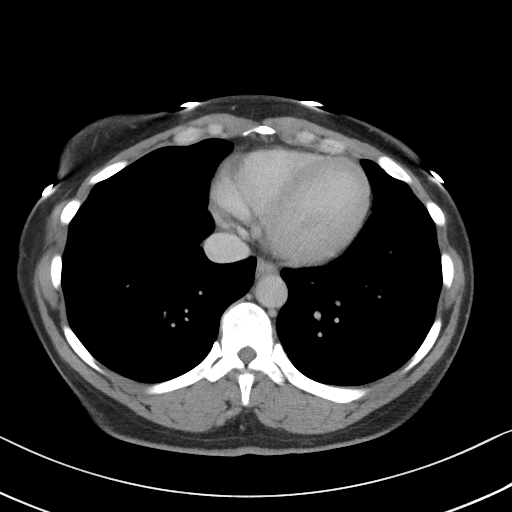

[Series 5: coronal st · coronal · 0.80mm/px · 3 of 79 slices shown]
[im 27/79  soft-tissue]
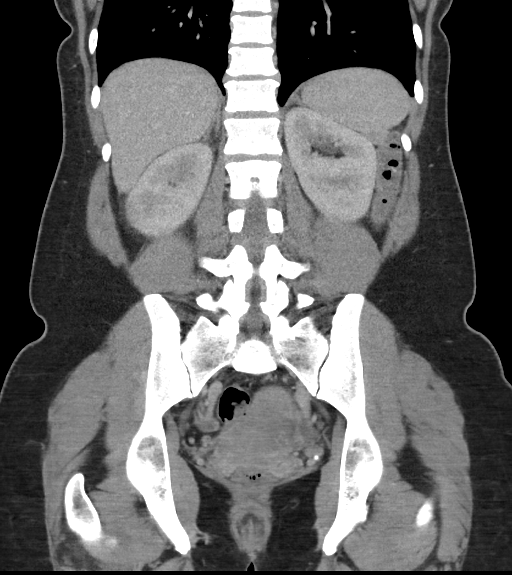
[im 35/79  soft-tissue]
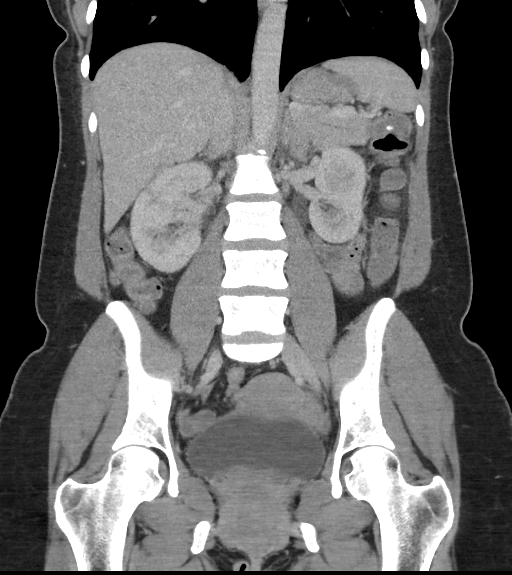
[im 44/79  soft-tissue]
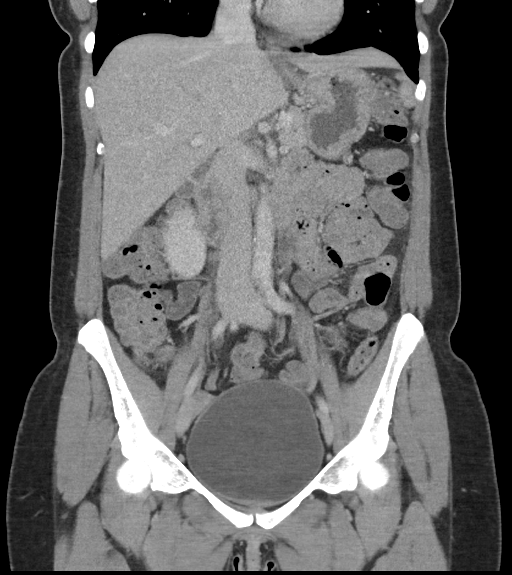

[16 of 46 positions shown; findings below may reference images not displayed]

FINDINGS: Lower chest: The visualized lung bases are clear. The visualized
heart and pericardium are unremarkable.

Hepatobiliary: The previously identified subtle mass within segment
8 of the liver is poorly visualized and extremely subtle, but
persists and demonstrates a small central hypointensity suggesting a
small central scar in the setting of an FNH. Inferiorly, within
segment 6 of the liver, is a new 18 mm hypodensity which is new from
prior examination and nonspecific. This may represent the sequela of
trauma or inflammation. A solid mass lesion is considered less
likely as this does not appear to demonstrate significant mass
effect upon the adjacent vasculature. The liver is otherwise
unremarkable. Gallbladder unremarkable. No intra or extrahepatic
biliary ductal dilation.

Pancreas: Unremarkable

Spleen: Unremarkable

Adrenals/Urinary Tract: The adrenal glands are unremarkable. The
kidneys are normal in size and position. Simple cortical cyst again
identified within the interpolar region of the left kidney. The
kidneys are otherwise unremarkable. The bladder is unremarkable.

Stomach/Bowel: Stomach is within normal limits. Appendix appears
normal. No evidence of bowel wall thickening, distention, or
inflammatory changes. Moderate stool within the colon. No free
intraperitoneal gas or fluid.

Vascular/Lymphatic: Minimal atherosclerotic calcification within the
abdominal aorta. No pathologic adenopathy within the abdomen and
pelvis.

Reproductive: Uterus and bilateral adnexa are unremarkable.

Other: Rectum unremarkable.  Tiny fat containing umbilical hernia.

Musculoskeletal: No acute bone abnormality.
IMPRESSION: No acute intra-abdominal pathology. No definite radiographic
explanation for the patient's reported symptoms.

Subtle, stable mass within segment 8 of the liver now demonstrating
a tiny central scar suggesting focal nodular hyperplasia. This could
be confirmed with contrast enhanced (Eovist) MRI examination.

Tiny hypodensity within the a inferior right hepatic lobe possibly
representing posttraumatic or post inflammatory change.

Moderate stool without evidence of obstruction.

Atherosclerotic calcification within the abdominal aorta, abnormal
in a patient of this age. Correlation for aggressive vascular risk
factors is recommended.

Aortic Atherosclerosis ([id number]).

## 2024-01-12 ENCOUNTER — Other Ambulatory Visit: Payer: Self-pay

## 2024-01-12 ENCOUNTER — Emergency Department (HOSPITAL_BASED_OUTPATIENT_CLINIC_OR_DEPARTMENT_OTHER)
Admission: EM | Admit: 2024-01-12 | Discharge: 2024-01-12 | Discharge status: Against Medical Advice | Payer: Medicaid Other | Attending: Emergency Medicine | Admitting: Emergency Medicine

## 2024-01-12 ENCOUNTER — Encounter (HOSPITAL_BASED_OUTPATIENT_CLINIC_OR_DEPARTMENT_OTHER): Payer: Self-pay | Admitting: Emergency Medicine

## 2024-01-12 DIAGNOSIS — Z5321 Procedure and treatment not carried out due to patient leaving prior to being seen by health care provider: Secondary | ICD-10-CM | POA: Diagnosis not present

## 2024-01-12 DIAGNOSIS — R519 Headache, unspecified: Secondary | ICD-10-CM | POA: Insufficient documentation

## 2024-01-12 DIAGNOSIS — Z20822 Contact with and (suspected) exposure to covid-19: Secondary | ICD-10-CM | POA: Diagnosis not present

## 2024-01-12 DIAGNOSIS — R059 Cough, unspecified: Secondary | ICD-10-CM | POA: Insufficient documentation

## 2024-01-12 DIAGNOSIS — R5383 Other fatigue: Secondary | ICD-10-CM | POA: Diagnosis not present

## 2024-01-12 LAB — RESP PANEL BY RT-PCR (RSV, FLU A&B, COVID)  RVPGX2
Influenza A by PCR: NEGATIVE
Influenza B by PCR: NEGATIVE
Resp Syncytial Virus by PCR: NEGATIVE
SARS Coronavirus 2 by RT PCR: NEGATIVE

## 2024-01-12 NOTE — ED Triage Notes (Signed)
 Pt reports she was dx with flu over 1 wk ago; sts she continues to have sxs such as HA, cough, fatigue; NAD in triage
